# Patient Record
Sex: Male | Born: 1945
Health system: Southern US, Community
[De-identification: ages and names within clinical notes are randomized; demographics above are authoritative.]

## PROBLEM LIST (undated history)

## (undated) DIAGNOSIS — I10 Essential (primary) hypertension: Secondary | ICD-10-CM

## (undated) DIAGNOSIS — K279 Peptic ulcer, site unspecified, unspecified as acute or chronic, without hemorrhage or perforation: Secondary | ICD-10-CM

## (undated) DIAGNOSIS — I420 Dilated cardiomyopathy: Secondary | ICD-10-CM

## (undated) DIAGNOSIS — F1011 Alcohol abuse, in remission: Secondary | ICD-10-CM

## (undated) DIAGNOSIS — F17201 Nicotine dependence, unspecified, in remission: Secondary | ICD-10-CM

## (undated) DIAGNOSIS — N182 Chronic kidney disease, stage 2 (mild): Secondary | ICD-10-CM

## (undated) HISTORY — DX: Peptic ulcer, site unspecified, unspecified as acute or chronic, without hemorrhage or perforation: K27.9

## (undated) HISTORY — DX: Nicotine dependence, unspecified, in remission: F17.201

## (undated) HISTORY — DX: Chronic kidney disease, stage 2 (mild): N18.2

## (undated) HISTORY — DX: Alcohol abuse, in remission: F10.11

## (undated) HISTORY — DX: Dilated cardiomyopathy: I42.0

## (undated) HISTORY — DX: Essential (primary) hypertension: I10

---

## 2004-11-05 HISTORY — PX: COLONOSCOPY: SHX174

## 2005-06-02 ENCOUNTER — Emergency Department (HOSPITAL_COMMUNITY): Admission: EM | Admit: 2005-06-02 | Discharge: 2005-06-03 | Payer: Self-pay | Admitting: *Deleted

## 2005-06-20 ENCOUNTER — Ambulatory Visit (HOSPITAL_COMMUNITY): Admission: RE | Admit: 2005-06-20 | Discharge: 2005-06-20 | Payer: Self-pay | Admitting: Family Medicine

## 2009-02-07 ENCOUNTER — Ambulatory Visit: Payer: Self-pay | Admitting: Cardiology

## 2009-02-07 ENCOUNTER — Inpatient Hospital Stay (HOSPITAL_COMMUNITY): Admission: EM | Admit: 2009-02-07 | Discharge: 2009-02-11 | Payer: Self-pay | Admitting: Emergency Medicine

## 2009-02-08 ENCOUNTER — Encounter (INDEPENDENT_AMBULATORY_CARE_PROVIDER_SITE_OTHER): Payer: Self-pay | Admitting: Internal Medicine

## 2009-02-17 ENCOUNTER — Ambulatory Visit: Payer: Self-pay | Admitting: Cardiology

## 2009-03-15 ENCOUNTER — Ambulatory Visit: Payer: Self-pay | Admitting: Gastroenterology

## 2009-03-15 DIAGNOSIS — D649 Anemia, unspecified: Secondary | ICD-10-CM

## 2009-03-16 ENCOUNTER — Encounter: Payer: Self-pay | Admitting: Gastroenterology

## 2009-03-17 ENCOUNTER — Ambulatory Visit: Payer: Self-pay | Admitting: Cardiology

## 2009-04-21 ENCOUNTER — Ambulatory Visit: Payer: Self-pay | Admitting: Cardiology

## 2009-06-16 ENCOUNTER — Ambulatory Visit: Payer: Self-pay | Admitting: Gastroenterology

## 2009-06-22 LAB — CONVERTED CEMR LAB
Eosinophils Absolute: 0.1 10*3/uL (ref 0.0–0.7)
Ferritin: 98 ng/mL (ref 22–322)
HCT: 35.3 % — ABNORMAL LOW (ref 39.0–52.0)
Lymphocytes Relative: 26 % (ref 12–46)
Lymphs Abs: 1.5 10*3/uL (ref 0.7–4.0)
MCHC: 32.3 g/dL (ref 30.0–36.0)
Monocytes Relative: 12 % (ref 3–12)
Neutro Abs: 3.5 10*3/uL (ref 1.7–7.7)
RBC: 3.48 M/uL — ABNORMAL LOW (ref 4.22–5.81)
RDW: 13.6 % (ref 11.5–15.5)
WBC: 5.8 10*3/uL (ref 4.0–10.5)

## 2009-06-28 ENCOUNTER — Ambulatory Visit (HOSPITAL_COMMUNITY): Admission: RE | Admit: 2009-06-28 | Discharge: 2009-06-28 | Payer: Self-pay | Admitting: Gastroenterology

## 2009-06-28 ENCOUNTER — Encounter: Payer: Self-pay | Admitting: Gastroenterology

## 2009-06-28 ENCOUNTER — Ambulatory Visit: Payer: Self-pay | Admitting: Gastroenterology

## 2009-06-30 ENCOUNTER — Telehealth (INDEPENDENT_AMBULATORY_CARE_PROVIDER_SITE_OTHER): Payer: Self-pay

## 2009-07-18 ENCOUNTER — Ambulatory Visit: Payer: Self-pay | Admitting: Cardiology

## 2009-07-18 DIAGNOSIS — A048 Other specified bacterial intestinal infections: Secondary | ICD-10-CM | POA: Insufficient documentation

## 2009-08-18 ENCOUNTER — Encounter: Payer: Self-pay | Admitting: Cardiology

## 2009-08-18 ENCOUNTER — Ambulatory Visit: Payer: Self-pay | Admitting: Cardiology

## 2009-08-26 LAB — CONVERTED CEMR LAB
ALT: 10 units/L (ref 0–53)
Albumin: 4.5 g/dL (ref 3.5–5.2)
Alkaline Phosphatase: 55 units/L (ref 39–117)
Basophils Absolute: 0 10*3/uL (ref 0.0–0.1)
Creatinine, Ser: 2.16 mg/dL — ABNORMAL HIGH (ref 0.40–1.50)
Eosinophils Absolute: 0.1 10*3/uL (ref 0.0–0.7)
Eosinophils Relative: 2 % (ref 0–5)
HCT: 33.3 % — ABNORMAL LOW (ref 39.0–52.0)
Hemoglobin: 10.9 g/dL — ABNORMAL LOW (ref 13.0–17.0)
Monocytes Relative: 12 % (ref 3–12)
Total Protein: 7.1 g/dL (ref 6.0–8.3)

## 2009-08-31 ENCOUNTER — Encounter (INDEPENDENT_AMBULATORY_CARE_PROVIDER_SITE_OTHER): Payer: Self-pay | Admitting: *Deleted

## 2010-02-01 ENCOUNTER — Encounter: Payer: Self-pay | Admitting: Cardiology

## 2010-03-21 ENCOUNTER — Encounter (INDEPENDENT_AMBULATORY_CARE_PROVIDER_SITE_OTHER): Payer: Self-pay | Admitting: *Deleted

## 2010-07-11 ENCOUNTER — Ambulatory Visit: Payer: Self-pay | Admitting: Cardiology

## 2010-07-11 LAB — CONVERTED CEMR LAB
AST: 16 units/L (ref 0–37)
Albumin: 4.4 g/dL (ref 3.5–5.2)
Chloride: 105 meq/L (ref 96–112)
Creatinine, Ser: 1.53 mg/dL — ABNORMAL HIGH (ref 0.40–1.50)
Eosinophils Absolute: 0.2 10*3/uL (ref 0.0–0.7)
Eosinophils Relative: 2 % (ref 0–5)
Glucose, Bld: 72 mg/dL (ref 70–99)
Hemoglobin: 14.2 g/dL (ref 13.0–17.0)
MCV: 101.7 fL — ABNORMAL HIGH (ref 78.0–100.0)
Neutro Abs: 4.1 10*3/uL (ref 1.7–7.7)
Neutrophils Relative %: 56 % (ref 43–77)
Platelets: 222 10*3/uL (ref 150–400)
RBC: 4.06 M/uL — ABNORMAL LOW (ref 4.22–5.81)
RDW: 13.4 % (ref 11.5–15.5)
WBC: 7.3 10*3/uL (ref 4.0–10.5)

## 2010-07-13 ENCOUNTER — Ambulatory Visit: Payer: Self-pay | Admitting: Cardiovascular Disease

## 2010-07-13 ENCOUNTER — Ambulatory Visit (HOSPITAL_COMMUNITY): Admission: RE | Admit: 2010-07-13 | Discharge: 2010-07-13 | Payer: Self-pay | Admitting: Cardiology

## 2010-07-13 ENCOUNTER — Encounter: Payer: Self-pay | Admitting: Cardiology

## 2010-07-25 ENCOUNTER — Ambulatory Visit: Payer: Self-pay | Admitting: Cardiology

## 2010-08-09 ENCOUNTER — Telehealth (INDEPENDENT_AMBULATORY_CARE_PROVIDER_SITE_OTHER): Payer: Self-pay

## 2010-08-09 ENCOUNTER — Encounter: Payer: Self-pay | Admitting: Cardiology

## 2010-12-05 NOTE — Assessment & Plan Note (Signed)
Summary: E9B   Visit Type:  Follow-up Referring Provider:  . Primary Provider:  Dr. Dorthey Sawyer   History of Present Illness: [name] returns to the office after having been lost to followup for the past year.  Over that interval, he has done remarkably well.  He reports only class II dyspnea on exertion, has not required urgent medical care, and has not in fact sought any medical care.  He has successfully completely discontinued use of tobacco products and alcohol.  He requested this appointment not to address any current symptomatology, but recognizing that he requires continued medical care for his cardiac condition.  Current Medications (verified): 1)  Carvedilol 25 Mg Tabs (Carvedilol) .... Tak Eone Tablet By Mouth Twice A Day 2)  Lisinopril 40 Mg Tabs (Lisinopril) .... Take 1 Tablet By Mouth Once Daily 3)  Furosemide 40 Mg Tabs (Furosemide) .... Take One Tablet By Mouth Daily. 4)  Spironolactone 25 Mg Tabs (Spironolactone) .... Take One Tablet By Mouth Daily  Allergies (verified): 1)  ! Timothy Bowen  Past History:  PMH, FH, and Social History reviewed and updated.  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  The patient denies anorexia, weight loss, weight gain, vision loss, decreased hearing, hoarseness, chest pain, syncope, peripheral edema, prolonged cough, headaches, abdominal pain, and melena.    Vital Signs:  Patient profile:   65 year old male Weight:      153 pounds BMI:     24.78 Pulse rate:   92 / minute BP sitting:   183 / 92  (right arm)  Vitals Entered By: Dreama Saa, CNA (July 11, 2010 2:15  PM)  Physical Exam  General:  Proportionate weight and height; well developed; no acute distress Neck-No JVD; no carotid bruits: Lungs-No tachypnea, no rales; no rhonchi; no wheezes: Cardiovascular-normal PMI; normal S1 and S2: modest systolic murmur at the cardiac apex. Abdomen-BS normal; soft and non-tender without masses or organomegaly:  Musculoskeletal-No deformities, no cyanosis or clubbing: Neurologic-Normal cranial nerves; symmetric strength and tone:  Skin-Warm, no significant lesions: Extremities-Nl distal pulses; no edema:     Impression & Recommendations:  Problem # 1:  HYPERTENSION (ICD-401.1) Blood pressure is much higher at this visit than on any past assessment.  Patient has exhausted his supply of both ACE inhibitor and diuretic, which may account for the increase in pressure.  Alternatively, he may have had recovery of left ventricular systolic function and be capable of generating higher intravascular pressure.  An echocardiogram will be obtained to reassess LV function.  He will resume all of the medicines that he has previously been prescribed, increasing the dose of lisinopril from 20 to 40 mg, and monitor blood pressure at local pharmacies.  Adequacy of this therapy will be reassessed at a visit 2 weeks hence.  Problem # 2:  TOBACCO ABUSE-QUIT (ICD-305.1) Patient is congratulated for abstaining from both cigarette and alcohol use.  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  A repeat echocardiogram will reassess LV systolic function and help to guide additional treatment.  Problem #  4:  RENAL DISEASE, CHRONIC, STAGE II (ICD-585.2) Renal function will be reassessed.  Other Orders: T-Comprehensive Metabolic Panel [phone]) T-CBC w/Diff ([id number]) 2-D Echocardiogram (2D Echo) Future Orders: T-Basic Metabolic Panel [id number]) ...  08/07/2010  Patient Instructions: 1)  Your physician recommends that you schedule a follow-up appointment in: 2 weeks 2)  Your physician recommends that you return for lab work in: Today and in 1 month 3)  Your physician has recommended you make the following change in your medication: Increase Lisinopril to 40mg  by mouth once daily  4)  Your physician has requested that you regularly monitor and record your blood pressure readings at home.  Please use the same machine at the same time of day to check your readings and record them to bring to your follow-up visit. You may check blood pressures at any Drug store as often as you can. 5)  Your physician has requested that you have an echocardiogram.  Echocardiography is a painless test that uses sound waves to create images of your heart. It provides your doctor with information about the size and shape of your heart and how well your heart's chambers and valves are working.  This procedure takes approximately one hour. There are no restrictions for this procedure. Prescriptions: FUROSEMIDE 40 MG TABS (FUROSEMIDE) Take one tablet by mouth daily.  #30 x 3   Entered by:   Larita Fife Via LPN   Authorized by:   Kathlen Brunswick, MD, [facility]   Signed by:   Larita Fife Via LPN on 40/08/2724   Method used:   Electronically to        [facility]  [address] (retail)       [address]       [place], [place]  [address]       Ph: [phone]       Fax: [phone]   RxID:   [id number] LISINOPRIL 40 MG TABS (LISINOPRIL) take 1 tablet by mouth once daily  #30 x 3   Entered by:   Larita Fife Via LPN   Authorized by:   Kathlen Brunswick, MD, [facility]   Signed by:   Larita Fife Via LPN on 09/32/3557   Method used:   Electronically to        [facility]  [address] (retail)       [address]       [place], [place]  [address]       Ph: [phone]       Fax: [phone]   RxID:   [id number]

## 2010-12-05 NOTE — Letter (Signed)
Summary: Appointment - Reminder 2  [facility] [place]. [address] [place] [address]   Phone: [phone]  Fax: [phone]     Mar 21, 2010 MRN: [id number]   Timothy Bowen [address] [place], [place]  [address]   Dear Timothy Bowen,  Our records indicate that it is time to schedule a follow-up appointment.  Dr.  Dietrich Pates        recommended that you follow up with [place] in      01/2010      . It is very important that we reach you to schedue this appointment. We look forward to participating in your health care needs. Please contact us at the number listed above at your earliest convenience to schedule your appointment.  If you are unable to make an appointment at this time, give [place] a call so we can update our records.     Sincerely,   Glass blower/designer

## 2010-12-05 NOTE — Letter (Signed)
Summary: [facility]  [address], [place] [address]   Phone: [phone]  Fax: [phone]     August 09, 2010 MRN: [id number]   [facility] [address] [place], [place] [address] [place], [place]  [address]      YOUR LAB WORK IS DUE  August 14, 2010 _________________________________________  Please go to [facility], located across the street from [facility] on the second floor.  Hours are Monday - Friday 7am until 7:30pm         Saturday 8am until 12noon    __  DO NOT EAT OR DRINK AFTER MIDNIGHT EVENING PRIOR TO LABWORK  _X_ YOUR LABWORK IS NOT FASTING --YOU MAY EAT PRIOR TO LABWORK

## 2010-12-05 NOTE — Assessment & Plan Note (Signed)
Summary: 2 WK F/U PER CHECKOUT ON 07/11/10/T   Visit Type:  Follow-up Referring Provider:  . Primary Provider:  Dr. Dorthey Sawyer  CC:  no cardiology complaints.  History of Present Illness: [name] is a very pleasant 65 y/o AAM with know history of dialated cardiomyopathy, CKD, hypertension and anemia.  He is here for follow-up after being seen by Dr. Dietrich Pates to be reestablished in our clinic.  At that time he was found to be hypertensive and lisinopril 40mg  along with lasix, spironolactone, and Coreg.  He was also planned for and ECHO and renal fx lab tests.  He is here for discussion of results.  He is without cardiac complaints.  Preventive Screening-Counseling & Management  Alcohol-Tobacco     Alcohol drinks/day: 0     Smoking Status: quit  Current Medications (verified): 1)  Carvedilol 25 Mg Tabs (Carvedilol) .... Tak Eone Tablet By Mouth Twice A Day 2)  Lisinopril 40 Mg Tabs (Lisinopril) .... Take 1 Tablet By Mouth Once Daily 3)  Furosemide 40 Mg Tabs (Furosemide) .... Take One Tablet By Mouth Daily. 4)  Spironolactone 25 Mg Tabs (Spironolactone) .... Take One Tablet By Mouth Daily  Allergies (verified): 1)  ! * Nkda PMH-FH-SH reviewed-no changes except otherwise noted  Social History: Alcohol drinks/day:  0 Smoking Status:  quit  Review of Systems       All other systems have been reviewed and are negative unless stated above.   Vital Signs:  Patient profile:   65 year old male Weight:      155 pounds BMI:     25.11 Pulse rate:   88 / minute BP sitting:   138 / 83  (right arm)  Vitals Entered By: Dreama Saa, CNA (July 25, 2010 11:16 AM)  Physical Exam  General:  Well developed, well nourished, in no acute distress. Lungs:  Clear bilaterally to auscultation and percussion. Heart:  Non-displaced PMI, chest non-tender; regular rate and rhythm, S1, S2 without murmurs, rubs or gallops. Carotid upstroke normal, no bruit. Normal abdominal aortic size, no  bruits. Femorals normal pulses, no bruits. Pedals normal pulses. No edema, no varicosities. Abdomen:  Bowel sounds positive; abdomen soft and non-tender without masses, organomegaly, or hernias noted. No hepatosplenomegaly. Pulses:  pulses normal in all 4 extremities Extremities:  No clubbing or cyanosis. Psych:  Normal affect.   Impression & Recommendations:  Problem # 1:  CARDIOMYOPATHY, DILATED (ICD-425.4) Review of echo reveals distal septal hypokinesis.  The cavity size was mildly dialated with mild concentric hyptertrophy.  EF normal at 50%-55%.  He is to continue on current medications and follow up with [place] in 6 months unless he becomes symptomatic.  We discussed the test results and encouraged him to take medications as directed. His updated medication list for this problem includes:    Carvedilol 25 Mg Tabs (Carvedilol) .Marland Kitchen... Tak eone tablet by mouth twice a day    Lisinopril 40 Mg Tabs (Lisinopril) .Marland Kitchen... Take 1 tablet by mouth once daily    Furosemide 40 Mg Tabs (Furosemide) .Marland Kitchen... Take one tablet by mouth daily.    Spironolactone 25 Mg Tabs (Spironolactone) .Marland Kitchen... Take one tablet by mouth daily  Problem # 2:  RENAL DISEASE, CHRONIC, STAGE II (ICD-585.2) Review of labs demonstrates Creatnine of 1.53, BUN 23 without other abnormalities.  K was 4.7.  He will continue the same medications.  His creatnine actually improved from 2.5 on last check.  Will continue to monitor this with follow-up labs on next visit.  Problem # 3:  HYPERTENSION (ICD-401.1) Blood pressure is significantly improved with the addtion of the medications. dropping from 183/92 to 138/83 today.  He is congratulated on this and reminded to take medications daily. His updated medication list for this problem includes:    Carvedilol 25 Mg Tabs (Carvedilol) .Marland Kitchen... Tak eone tablet by mouth twice a day    Lisinopril 40 Mg Tabs (Lisinopril) .Marland Kitchen... Take 1 tablet by mouth once daily    Furosemide 40 Mg Tabs (Furosemide) .Marland Kitchen...  Take one tablet by mouth daily.    Spironolactone 25 Mg Tabs (Spironolactone) .Marland Kitchen... Take one tablet by mouth daily   Patient Instructions: 1)  Your physician recommends that you schedule a follow-up appointment in: 6 months 2)  Your physician recommends that you continue on your current medications as directed. Please refer to the Current Medication list given to you today. Prescriptions: LISINOPRIL 40 MG TABS (LISINOPRIL) take 1 tablet by mouth once daily  #30 x 3   Entered by:   Teressa Lower RN   Authorized by:   Joni Reining, NP   Signed by:   Teressa Lower RN on 07/25/2010   Method used:   Electronically to        [facility]  [address] [address] (retail)       [address]       [place], [place]  [address]       Ph: [phone]       Fax: [phone]   RxID:   [id number]

## 2010-12-05 NOTE — Miscellaneous (Signed)
Summary: Orders Update  Clinical Lists Changes  Orders: Added new Referral order of 2-D Echocardiogram (2D Echo) - Signed 

## 2010-12-05 NOTE — Progress Notes (Signed)
Summary: address  Phone Note Outgoing Call   Call placed by: Larita Fife Via LPN,  August 09, 2010 10:11 AM Summary of Call: Pt. receives mail at post office, not his home. Remailed lab orders. Initial call taken by: Larita Fife Via LPN,  August 09, 2010 10:12 AM

## 2010-12-05 NOTE — Letter (Signed)
Summary: [place]  [address] [place] [address]   Phone: [phone]  Fax: [phone]     July 11, 2010 MRN: [id number]   Timothy Bowen [address] [place], [place]  [address]      YOUR LAB WORK IS DUE  August 07, 2010 _________________________________________  Please go to [facility], located across the street from [facility] on the second floor.  Hours are Monday - Friday 7am until 7:30pm         Saturday 8am until 12noon    __  DO NOT EAT OR DRINK AFTER MIDNIGHT EVENING PRIOR TO LABWORK  __ YOUR LABWORK IS NOT FASTING --YOU MAY EAT PRIOR TO LABWORK

## 2011-01-09 DIAGNOSIS — F17201 Nicotine dependence, unspecified, in remission: Secondary | ICD-10-CM

## 2011-01-09 DIAGNOSIS — I10 Essential (primary) hypertension: Secondary | ICD-10-CM | POA: Insufficient documentation

## 2011-01-09 DIAGNOSIS — F1011 Alcohol abuse, in remission: Secondary | ICD-10-CM

## 2011-01-31 ENCOUNTER — Ambulatory Visit: Payer: Self-pay | Admitting: Cardiology

## 2011-02-14 LAB — URINALYSIS, ROUTINE W REFLEX MICROSCOPIC
Nitrite: NEGATIVE
Specific Gravity, Urine: <1.005 — ABNORMAL LOW (ref 1.005–1.030)
pH: 5.5 (ref 5.0–8.0)

## 2011-02-14 LAB — DIFFERENTIAL
Basophils Absolute: 0 10*3/uL (ref 0.0–0.1)
Basophils Absolute: 0 10*3/uL (ref 0.0–0.1)
Basophils Relative: 0 % (ref 0–1)
Basophils Relative: 0 % (ref 0–1)
Eosinophils Relative: 0 % (ref 0–5)
Lymphocytes Relative: 7 % — ABNORMAL LOW (ref 12–46)
Lymphs Abs: 1 10*3/uL (ref 0.7–4.0)
Monocytes Absolute: 1.2 10*3/uL — ABNORMAL HIGH (ref 0.1–1.0)
Monocytes Absolute: 1.3 10*3/uL — ABNORMAL HIGH (ref 0.1–1.0)
Monocytes Relative: 12 % (ref 3–12)
Neutro Abs: 12.6 10*3/uL — ABNORMAL HIGH (ref 1.7–7.7)
Neutrophils Relative %: 72 % (ref 43–77)

## 2011-02-14 LAB — CBC
Hemoglobin: 10.4 g/dL — ABNORMAL LOW (ref 13.0–17.0)
MCHC: 33.7 g/dL (ref 30.0–36.0)
MCHC: 34.1 g/dL (ref 30.0–36.0)
MCV: 105.4 fL — ABNORMAL HIGH (ref 78.0–100.0)
RBC: 2.9 MIL/uL — ABNORMAL LOW (ref 4.22–5.81)
RBC: 3.53 MIL/uL — ABNORMAL LOW (ref 4.22–5.81)
RDW: 13.9 % (ref 11.5–15.5)
WBC: 11.7 10*3/uL — ABNORMAL HIGH (ref 4.0–10.5)

## 2011-02-14 LAB — COMPREHENSIVE METABOLIC PANEL
Alkaline Phosphatase: 65 U/L (ref 39–117)
BUN: 28 mg/dL — ABNORMAL HIGH (ref 6–23)
GFR calc non Af Amer: 41 mL/min — ABNORMAL LOW (ref >60)
Glucose, Bld: 102 mg/dL — ABNORMAL HIGH (ref 70–99)
Potassium: 4.1 mEq/L (ref 3.5–5.1)
Total Bilirubin: 0.5 mg/dL (ref 0.3–1.2)
Total Protein: 5.8 g/dL — ABNORMAL LOW (ref 6.0–8.3)

## 2011-02-14 LAB — BASIC METABOLIC PANEL
CO2: 27 mEq/L (ref 19–32)
CO2: 28 mEq/L (ref 19–32)
Calcium: 8.6 mg/dL (ref 8.4–10.5)
Calcium: 8.6 mg/dL (ref 8.4–10.5)
Chloride: 103 mEq/L (ref 96–112)
Creatinine, Ser: 1.65 mg/dL — ABNORMAL HIGH (ref 0.4–1.5)
GFR calc Af Amer: 48 mL/min — ABNORMAL LOW (ref >60)
GFR calc non Af Amer: 38 mL/min — ABNORMAL LOW (ref >60)
GFR calc non Af Amer: 40 mL/min — ABNORMAL LOW (ref >60)
GFR calc non Af Amer: 43 mL/min — ABNORMAL LOW (ref >60)
Glucose, Bld: 111 mg/dL — ABNORMAL HIGH (ref 70–99)
Glucose, Bld: 96 mg/dL (ref 70–99)
Potassium: 3.7 mEq/L (ref 3.5–5.1)
Sodium: 134 mEq/L — ABNORMAL LOW (ref 135–145)
Sodium: 135 mEq/L (ref 135–145)
Sodium: 136 mEq/L (ref 135–145)

## 2011-02-14 LAB — LIPID PANEL
HDL: 32 mg/dL — ABNORMAL LOW (ref >39)
LDL Cholesterol: 88 mg/dL (ref 0–99)
Total CHOL/HDL Ratio: 4.1 RATIO
VLDL: 11 mg/dL (ref 0–40)

## 2011-02-14 LAB — CARDIAC PANEL(CRET KIN+CKTOT+MB+TROPI)
CK, MB: 1 ng/mL (ref 0.3–4.0)
CK, MB: 1.2 ng/mL (ref 0.3–4.0)
Relative Index: 1.1 (ref 0.0–2.5)

## 2011-02-14 LAB — TSH: TSH: 2.394 u[IU]/mL (ref 0.350–4.500)

## 2011-02-14 LAB — IRON AND TIBC
Iron: <10 ug/dL — ABNORMAL LOW (ref 42–135)
UIBC: 241 ug/dL

## 2011-02-14 LAB — URINE MICROSCOPIC-ADD ON

## 2011-02-14 LAB — POCT CARDIAC MARKERS: CKMB, poc: 1.4 ng/mL (ref 1.0–8.0)

## 2011-02-14 LAB — HEPATIC FUNCTION PANEL
ALT: 18 U/L (ref 0–53)
Alkaline Phosphatase: 61 U/L (ref 39–117)

## 2011-02-14 LAB — D-DIMER, QUANTITATIVE: D-Dimer, Quant: 1.91 ug/mL-FEU — ABNORMAL HIGH (ref 0.00–0.48)

## 2011-02-14 LAB — MAGNESIUM: Magnesium: 2.2 mg/dL (ref 1.5–2.5)

## 2011-02-14 LAB — BRAIN NATRIURETIC PEPTIDE: Pro B Natriuretic peptide (BNP): 1240 pg/mL — ABNORMAL HIGH (ref 0.0–100.0)

## 2011-02-26 ENCOUNTER — Encounter: Payer: Self-pay | Admitting: *Deleted

## 2011-02-26 ENCOUNTER — Encounter: Payer: Self-pay | Admitting: Cardiology

## 2011-02-26 ENCOUNTER — Ambulatory Visit (INDEPENDENT_AMBULATORY_CARE_PROVIDER_SITE_OTHER): Payer: Self-pay | Admitting: Cardiology

## 2011-02-26 DIAGNOSIS — I428 Other cardiomyopathies: Secondary | ICD-10-CM

## 2011-02-26 DIAGNOSIS — F17201 Nicotine dependence, unspecified, in remission: Secondary | ICD-10-CM

## 2011-02-26 DIAGNOSIS — I420 Dilated cardiomyopathy: Secondary | ICD-10-CM | POA: Insufficient documentation

## 2011-02-26 DIAGNOSIS — Z87891 Personal history of nicotine dependence: Secondary | ICD-10-CM

## 2011-02-26 DIAGNOSIS — N182 Chronic kidney disease, stage 2 (mild): Secondary | ICD-10-CM

## 2011-02-26 DIAGNOSIS — I10 Essential (primary) hypertension: Secondary | ICD-10-CM

## 2011-02-26 MED ORDER — AMLODIPINE BESYLATE 5 MG PO TABS: 5.0000 mg | ORAL_TABLET | Freq: Every day | ORAL | Status: DC

## 2011-02-26 NOTE — Assessment & Plan Note (Signed)
Blood pressure control somewhat suboptimal; antihypertensives will be changed as described above.

## 2011-02-26 NOTE — Patient Instructions (Signed)
Your physician recommends that you schedule a follow-up appointment in: 6 MONTHS Your physician has recommended you make the following change in your medication: STOP ALDACTONE, DECREASE LASIX TO 20MG  DAILY CALL FOR 5LB WEIGHT GAIN, AMLODIPINE 5MG  DIALY, Your physician has requested that you have en exercise stress myoview. For further information please visit [email or web address]. Please follow instruction sheet, as given.

## 2011-02-26 NOTE — Progress Notes (Signed)
HPI : [name] returns to the office as scheduled for continued assessment and treatment of hypertension, hypertensive heart disease and cardiomyopathy.  Since his last visit, he has done quite well.  He is class 1-2 dyspnea on exertion.  Experiences no chest discomfort, orthopnea, PND nor pedal edema.  He has completely given up tobacco use and alcohol use, and feels better than he has for some time.  Ejection fraction, which initially was 25%, recovered to 50%.  Blood pressure has been fairly well controlled with systolics in the 130-150 range  Current Outpatient Prescriptions on File Prior to Visit  Medication Sig Dispense Refill  . carvedilol (COREG) 25 MG tablet Take 25 mg by mouth 2 (two) times daily.        . furosemide (LASIX) 40 MG tablet Take 20 mg by mouth daily.       Marland Kitchen lisinopril (PRINIVIL,ZESTRIL) 40 MG tablet Take 40 mg by mouth daily.        Marland Kitchen DISCONTD: spironolactone (ALDACTONE) 25 MG tablet Take 25 mg by mouth daily.        Marland Kitchen amLODipine (NORVASC) 5 MG tablet Take 1 tablet (5 mg total) by mouth daily.  30 tablet  6     No Known Allergies    Past medical history, social history, and family history reviewed and updated.  ROS: See history of present illness.  PHYSICAL EXAM: BP 150/88  Pulse 71  Ht 6' (1.829 m)  Wt 153 lb (69.4 kg)  BMI 20.75 kg/m2  SpO2 93%  General-Well developed; no acute distress Body habitus-proportionate weight and height Neck-No JVD; no carotid bruits Lungs-clear lung fields; resonant to percussion Cardiovascular-normal PMI; normal S1 and S2; modest systolic ejection murmur at the lower left sternal border Abdomen-normal bowel sounds; soft and non-tender without masses or organomegaly Musculoskeletal-No deformities, no cyanosis or clubbing Neurologic-Normal cranial nerves; symmetric strength and tone Skin-Warm, no significant lesions Extremities-distal pulses intact; no edema  ASSESSMENT AND PLAN:

## 2011-02-26 NOTE — Assessment & Plan Note (Signed)
Substantial improvement both symptomatically and in terms of left ventricular systolic function.  There are multiple possibilities to account for this difference.  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  The medications may be providing benefit that is out of proportion to the response we typically see.  He could have coronary disease with improved ischemia under therapy, or he could have a hypertensive cardiomyopathy on her blood pressure has not been high enough to warrant this diagnosis.  We will proceed with a stress nuclear study to exclude significant obstructive coronary disease.  With near normal LV systolic function, his dose of furosemide will be decreased to 20 mg q.d.; Aldactone will be eliminated; and amlodipine will be started for better blood pressure control.

## 2011-03-05 ENCOUNTER — Other Ambulatory Visit: Payer: Self-pay | Admitting: Cardiology

## 2011-03-05 DIAGNOSIS — I420 Dilated cardiomyopathy: Secondary | ICD-10-CM

## 2011-03-06 ENCOUNTER — Ambulatory Visit (HOSPITAL_COMMUNITY): Payer: Self-pay | Attending: Cardiology

## 2011-03-06 ENCOUNTER — Encounter (HOSPITAL_COMMUNITY): Payer: Self-pay

## 2011-03-06 ENCOUNTER — Ambulatory Visit (INDEPENDENT_AMBULATORY_CARE_PROVIDER_SITE_OTHER): Payer: Self-pay | Admitting: *Deleted

## 2011-03-06 ENCOUNTER — Encounter (HOSPITAL_COMMUNITY)
Admission: RE | Admit: 2011-03-06 | Discharge: 2011-03-06 | Disposition: A | Discharge status: Home / Self Care | Payer: Self-pay | Source: Ambulatory Visit | Attending: Cardiology | Admitting: Cardiology

## 2011-03-06 DIAGNOSIS — I428 Other cardiomyopathies: Secondary | ICD-10-CM | POA: Insufficient documentation

## 2011-03-06 DIAGNOSIS — I420 Dilated cardiomyopathy: Secondary | ICD-10-CM

## 2011-03-06 MED ORDER — TECHNETIUM TC 99M TETROFOSMIN IV KIT
10.0000 | PACK | Freq: Once | INTRAVENOUS | Status: AC | PRN
  Administered 2011-03-06: 9.4 via INTRAVENOUS

## 2011-03-06 MED ORDER — TECHNETIUM TC 99M TETROFOSMIN IV KIT
30.0000 | PACK | Freq: Once | INTRAVENOUS | Status: AC | PRN
  Administered 2011-03-06: 28.5 via INTRAVENOUS

## 2011-03-06 NOTE — Progress Notes (Signed)
Exercise Treadmill Test   Please see dictated report for Stress Nuclear Study.

## 2011-03-20 NOTE — Consult Note (Signed)
NAME:  Timothy Bowen, Timothy Bowen              ACCOUNT NO.:  [id number]   MEDICAL RECORD NO.:  [id number]          PATIENT TYPE:  INP   LOCATION:  IC04                          FACILITY:  [facility]   PHYSICIAN:  Gerrit Friends. Dietrich Pates, MD, FACCDATE OF BIRTH:  January 23, 1946   DATE OF CONSULTATION:  02/08/2009  DATE OF DISCHARGE:                                 CONSULTATION   REFERRING PHYSICIAN:  Dr. Sherle Poe, of [facility].   PRIMARY CARE PHYSICIAN:  Dr. Nobie Putnam.   REASON FOR CONSULTATION:  Congestive heart failure.   HISTORY OF PRESENT ILLNESS:  Timothy Bowen is a 65 year old male with  untreated hypertension who presented to the [facility]  emergency room yesterday with complaints of shortness of breath for the  past 2 days.  He notes dyspnea with exertion and describes NYHA Class  III-IV symptoms.  He notes positive orthopnea as well as paroxysmal  nocturnal dyspnea.  He denies chest pain or syncope.  He denies cough or  wheezing.  He eventually decided to come to the emergency room where a  chest x-ray demonstrated congestive heart failure and his BNP was noted  to be elevated.  Blood pressure was significantly elevated at 193/115.  He was given Lasix IV 40 mg, nitroglycerin and Lopressor.  He has  apparently diuresed well, although the records do not indicate this.  He  feels much better with decreased shortness of breath.   PAST MEDICAL HISTORY:  Is as outlined above.  He denies any prior  cardiology evaluation or admission to the hospital with heart failure.  He denies any surgical history.   MEDICATIONS AT HOME:  None.   ALLERGIES:  No known drug allergies.   SOCIAL HISTORY:  The patient lives in [place] by himself.  He drinks  about 2 beers per day.  He has a 60 pack-year history of smoking, he  quit smoking about 2 months ago.  He has a prior history of marijuana  and cocaine use.  He denies using cocaine for the last 8 months.  He is  unemployed and previously  was employed with the [employer]  plant.   FAMILY HISTORY:  Significant for a stroke in his mother who is deceased.  Two sisters have CAD.  They had bypass surgery in their 39s.   REVIEW OF SYSTEMS:  Please see HPI.  Denies fevers but has had chills.  Denies headache or sore throat, rash, dysuria, hematuria, melena,  dysphagia.  He has had some lower extremity pain with walking.  He also  notes numbness in his right upper extremity and right lower extremity  for the last year.  He denies associated weakness.  He did have some  bright red blood per rectum recently after straining with a difficult  bowel movement.  All other systems reviewed and negative.   PHYSICAL EXAM:  He is a well-nourished, well-developed male in no acute  distress.  Blood pressure 127/77, pulse 100, respirations 18,  temperature 98.6, saturation 98% on 2 liters, T-max 100.5, ins and outs  442 in/450 out.  HEENT:  Normal.  NECK:  With minimal JVD.  LYMPH:  Without lymphadenopathy.  ENDOCRINE:  Without thyromegaly.  CARDIAC:  Normal S1-S2, regular rate and rhythm.  Positive S3.  Questionable early 1/6 systolic ejection murmur heard best along left  sternal border.  Two component friction rub also noted.  LUNGS:  With bibasilar rales, no wheezes.  SKIN:  Warm and dry without rash.  ABDOMEN:  Soft, nontender with normoactive bowel sounds.  No  organomegaly.  EXTREMITIES:  Without clubbing, cyanosis or edema.  MUSCULOSKELETAL:  Without joint deformity.  NEUROLOGIC:  He is alert and oriented x3.  Cranial nerves II-XII are  grossly intact.  Strength is 5/5 in all extremities and axial groups.  VASCULAR:  No carotid bruits noted bilaterally.  Dorsalis pedis and  posterior tibialis pulses are 1+ bilaterally.   Chest x-ray:  Cardiac enlargement with vascular congestion and mild  interstitial edema; basilar atelectasis.  Electrocardiogram:  Sinus  tachycardia with a heart rate of 117, RSR prime right and  left atrial  enlargement, left ventricular hypertrophy, RSR prime nonspecific ST-T  wave changes, prolonged QT with a QTc of 496.   LABS:  White count 11,700, hemoglobin 10.4, hematocrit 30.8, MCV 106,  potassium 3.7, creatinine 1.81, glucose 11, BNP 1240, D-dimer 1.91,  troponin-I of 0.08, 0.04, 0.06.   ASSESSMENT:  1. Acute congestive heart failure in the setting of uncontrolled      hypertensive crisis.  2. Renal insufficiency.  His prior creatinine in 2006 was 1.1.  3. Macrocytic anemia.  4. Elevated D-dimer.  5. Possible pericarditis based upon two component friction rub on      exam.  This raises the possibility of coronary artery disease with      recent myocardial infarction or pericarditis/myocarditis, human      immunodeficiency virus.  6. Ex-smoker.  7. Family history of coronary artery disease.  8. Prolonged QT.   RECOMMENDATIONS:  The patient was also interviewed and examined by Dr.  Dietrich Pates.  An echocardiogram will be obtained to evaluate for systolic  vs. diastolic dysfunction.  His diuresis will be continued with IV Lasix  and this will be increased to 40 mg 2 times daily.  Daily weights and  strict ins and outs will be kept.  His nitroglycerin will be weaned off.  He will be placed on amlodipine 5 mg a day for blood pressure control.  Will change his Lopressor over to carvedilol.  He may get a better blood  pressure effect from a nonselective beta blocker.  If he has systolic  dysfunction this would also be beneficial.  No ACE inhibitor will be  used at this time secondary to elevated creatinine.  Question if his  kidney disease is related to his uncontrolled hypertension.  We could  consider the addition of hydralazine and/or nitrates, if needed,  especially if his LV function is depressed.  We can adjust his  medications further based upon his LV function.  He does have an  elevated D-dimer, but the likelihood of simultaneous pulmonary embolism  and congestive  heart failure is low.  We do not plan to investigate this  further at present.  B12 and folate will be obtained secondary to his  macrocytic anemia as well as LFTs and a fasting lipid panel.  Will also  obtain a sed rate, ANA rheumatoid factor, TSH, magnesium, iron, TIBC.  His stools will be checked for blood x2.  Renal ultrasound as well as an  abdominal ultrasound will be obtained.  We will try  to compare his  current electrocardiogram to his prior electrocardiograms to further  assess his prolonged QT.  A urinalysis will also be obtained to assess  for proteinuria.  Additional evaluation and treatment will be determined  by his echocardiogram results.   Thank you very much for the consultation.  We will be glad to follow the  patient throughout the remainder of this admission.      Tereso Newcomer, PA-C      Gerrit Friends. Dietrich Pates, MD, [facility]  Electronically Signed    SW/MEDQ  D:  02/08/2009  T:  02/08/2009  Job:  [id number]   cc:   Margaretmary Dys, M.D.

## 2011-03-20 NOTE — H&P (Signed)
NAME:  Timothy Bowen, Timothy Bowen              ACCOUNT NO.:  [id number]   MEDICAL RECORD NO.:  [id number]          PATIENT TYPE:  INP   LOCATION:  IC04                          FACILITY:  [facility]   PHYSICIAN:  Margaretmary Dys, M.D.DATE OF BIRTH:  Nov 22, 1945   DATE OF ADMISSION:  02/07/2009  DATE OF DISCHARGE:  LH                              HISTORY & PHYSICAL   ADMISSION DIAGNOSES:  1. Malignant hypertension.  2. Congestive heart failure.  3. Cannot rule out angina equivalent.  4. History of noncompliance to medications.  5. Probable transient ischemic attack in the past.   CHIEF COMPLAINT:  Shortness of breath.   HISTORY OF PRESENT ILLNESS:  Timothy Bowen is a 65 year old male who  presented to the emergency room with shortness of breath.  The patient  said he was diagnosed with hypertension about 9 months ago but has been  unable to afford the medications because he lost his insurance.  The  patient reports some chronic right arm and leg numbness for the past  year.  However, his shortness of breath started 2 days ago.  He said it  had been pretty gradual.  The patient says it was exacerbated by  exertion.  He also noticed some significant shortness of breath when he  is sleeping at night and he is unable to lay flat.  He has to sometimes  wake up and sit up to breathe.  The patient states usually the shortness  of breath is relieved by him resting.  The patient denied any chest  pain.  He has had no nausea and vomiting.  He denies any fevers or  chills.  No weight changes.  He denies any leg edema.  He was evaluated  in the emergency room and was found to have severe hypertension with  blood pressure close to 200.  The patient was also in congestive heart  failure.  The patient was subsequently started on nitroglycerin infusion  with some improvement in his blood pressure now.   The patient again continued to say he had no chest pain.   REVIEW OF SYSTEMS:  A 10-point review of systems is  negative except as  mentioned in the history of present illness.   PAST MEDICAL HISTORY:  Hypertension diagnosed about 9 months ago, the  patient is not on any therapy.   PAST SURGICAL HISTORY:  None of note.   MEDICATIONS:  The patient does not take any medicines.   ALLERGIES:  No known drug allergies.   FAMILY HISTORY:  Positive for hypertension and coronary artery disease.  His father died of a myocardial infarction.  His mother died of a  stroke.  He has other brothers and sisters who also have significant  history of hypertensive heart disease.   SOCIAL HISTORY:  The patient smoked about a pack of cigarettes for 40  years, but quit about a week ago.  He drinks alcohol, about 2-3 cups of  beer.  He has no illicit drug use.  He denies any other recreational  drug use.  The patient is currently single and has three children who  are  healthy.  He is currently unemployed after having lost his job at a  [employer] recently.   PHYSICAL EXAMINATION:  GENERAL:  Conscious, alert, comfortable not in  acute distress.  Well-oriented to time, place and person.  VITAL SIGNS:  Blood pressure 198/123 with pulse of 96.  Respiratory rate  was ranging between 20 to 24.  Temperature was 98.5 degrees Fahrenheit.  Oxygen saturation was 98% on 2 liters nasal cannula.  HEENT:  Normocephalic and atraumatic.  Oral mucosa was moist with no  exudates.  NECK:  Supple with no JVD or lymphadenopathy.  LUNGS:  Bilateral crackles mostly at the bases.  No wheeze, no rhonchi.  HEART:  S1 and S2 regular.  No S3, S4, gallops or rubs.  ABDOMEN:  Soft, nontender and bowel sounds positive.  No masses  palpable.  EXTREMITIES:  Trace edema bilaterally with no calf induration or  tenderness noted.  CNS:  Grossly intact.  The patient had no focal neurological deficits.  Sensation was also intact.   LABORATORY DATA:  White blood cell count 14.8, hemoglobin 12.7,  hematocrit 37.2, platelet count 251 with 85%  neutrophils.  D-dimer was  1.9.  Sodium 134, potassium 4.1, chloride 103, CO2 22, glucose 121, BUN  17, creatinine 1.65, calcium 8.9.  Initial cardiac markers were  negative.  BNP was 1190.   Chest x-ray shows evidence of pulmonary vascular congestion with basilar  atelectasis consistent with pulmonary edema.   A 12-lead EKG revealed normal sinus rhythm with some sinus tachycardia  and no acute ST-T changes.   ASSESSMENT/PLAN:  This is a 65 year old male with history of poorly  controlled hypertension who now presents with severe malignant  hypertension and he is in congestive heart failure.  Primary concern is  that this may represent some angina equivalent.  1. The patient will be admitted to the intensive care unit.  2. We will continue the patient on intravenous nitroglycerin.  3. We will cycle his cardiac enzymes.  4. We will obtain cardiology consult with [facility].  5. We will obtain a two-dimensional echocardiogram in the morning.  6. We will start the patient on metoprolol 25 mg p.o. b.i.d.  7. We will place the patient on Lasix 40 mg IV daily.  The patient      already received a Lasix dose in the emergency room.  We will watch      his potassium and electrolytes.  8. We will put him on deep venous thrombosis prophylaxis with Lovenox.  9. I do not see any indication for gastrointestinal prophylaxis at      this time.  10.I will start the patient on aspirin 81 mg once a day.  The patient      is having symptoms that may be suggestive of a prior transient      ischemic attack.  We will continue to follow him closely.  The      patient may need a CT scan or MRI later when more stable to further      evaluate for any prior history and basically to risk stratify him.  11.Smoking cessation counseling and encouragement was given to the      patient.  12.We will obtain a fasting lipid profile in the morning on him.   Critical care time was 40 minutes.      Margaretmary Dys, M.D.  Electronically Signed     AM/MEDQ  D:  02/07/2009  T:  02/08/2009  Job:  [id number]

## 2011-03-20 NOTE — Assessment & Plan Note (Signed)
[facility]                       [facility] OFFICE NOTE   SAADIQ, POCHE                     MRN:          [id number]  DATE:03/17/2009                            DOB:          December 05, 1945    Mr. Avis Mcmahill returns to the office for continued assessment and  treatment of cardiomyopathy with severe hypertension and mild chronic  kidney disease.  Since his last visit, he has done superbly.  He is  walking and riding a bicycle without any dyspnea or chest discomfort.  He notes no orthopnea nor PND.  He [facility], but they recently were unable to provide him with  any of his drugs.   CURRENT MEDICATIONS:  1. Ramipril 2.5 mg b.i.d.  2. Spironolactone 25 mg daily.  3. Carvedilol 6.25 mg b.i.d. (supposed to be taking 12.5 mg b.i.d.).  4. Furosemide 40 mg daily.  5. Amlodipine 5 mg daily.  6. Aspirin 81 mg daily.   SOCIAL HISTORY:  Reviewed.  Alcohol consumption was never excessive,  generally six-pack of beer per week.  This was discontinued in April.  He also had a 40-pack-year history of cigarette consumption, which was  discontinued likely before he discontinued alcohol.   On exam, trim, pleasant gentleman in no acute distress.  The weight is 140 pounds, 3 pounds more than last month.  Blood pressure  155/90, heart rate 80 and regular, respirations 14 and unlabored.  NECK:  No jugular venous distention.  LUNGS:  Clear.  CARDIAC:  Normal first and second heart sounds; modest early systolic  ejection murmur.  Normal PMI.  ABDOMEN:  Soft and nontender.  EXTREMITIES:  No edema.   Most recent laboratories from last month.  Anemia has improved, but  still persistent with a hemoglobin of 11.8 and an MCV of 100.  Chemistry  profile showed improved renal function with a BUN of 31 and a creatinine  of 1.66.   IMPRESSION:  Mr. Tres Grzywacz has cardiomyopathy of uncertain  etiology.  Coronary disease is still a consideration, and cardiac  catheterization is probably a test that should be performed at some  point.  For the time being, we will continue to adjust his medications.  Ramipril will be changed to lisinopril 20 mg daily.  Carvedilol will be  increased to 25 mg b.i.d.  He will return in 1 month at which time a chemistry profile will be  repeated.  He will ultimately need a repeat echocardiogram and  consideration for a defibrillator.     Gerrit Friends. Dietrich Pates, MD, [facility]  Electronically Signed    RMR/MedQ  DD: 03/17/2009  DT: 03/18/2009  Job #: [id number]   cc:   in [facility] Department

## 2011-03-20 NOTE — Letter (Signed)
April 21, 2009    Timothy Mckusick, MD  [phone] [place] Timothy Bowen, [place] [address]   RE:  Timothy, Bowen  MRN:  [id number]  /  DOB:  Mar 02, 1946   Dear Timothy Bowen,   Timothy Bowen returns to the office for continued assessment and treatment  of hypertension and cardiomyopathy.  Since his last visit, he has done  extremely well.  He reports no dyspnea, lightheadedness nor syncope.  He  has no pedal edema.  He has gained approximately 10 pounds over the past  few months.  He is able to work in the garden without difficulty.   CURRENT MEDICATIONS:  Supposed to include,  1. Spironolactone 25 mg daily.  2. Carvedilol 25 mg b.i.d.  3. Furosemide 40 mg daily.  4. Amlodipine 5 mg daily.  5. Aspirin 81 mg daily.  6. Lisinopril 20 mg daily.   Unfortunately, he is taking 2 different prescriptions of carvedilol, one  for 25 mg b.i.d. and one for 6.25 mg b.i.d.  He is not taking any ACE  inhibitor.   He was evaluated by GI apparently, but was unable to afford the prep for  what I believe was his colonoscopy.  It sounds as if he will not be able  to pursue that evaluation or will have to do it through some plan for  uninsured patients.  He is refraining from use of alcohol or cigarettes  and he is congratulated on that.   PHYSICAL EXAMINATION:  GENERAL:  Trim, pleasant gentleman in no acute  distress.  VITAL SIGNS:  The weight is 147, 7 pounds more than 1 month ago.  Blood  pressure 120/75, heart rate 85 and regular, respirations 12 and  unlabored.  NECK:  No jugular venous distention.  LUNGS:  Clear.  CARDIAC:  Normal first heart sounds; increased intensity of the second  heart sounds; no third heart sound appreciated; minimal basilar systolic  murmur.  ABDOMEN:  Soft and nontender; no organomegaly.  EXTREMITIES:  No edema.   IMPRESSION:  [name] is doing beautifully with his current  medical regime.  ACE inhibitor will reduce his long-term morbidity and  mortality and will  be added as planned.  Aspirin is offering him no  benefit and will be discontinued.  He may ultimately get off amlodipine  if blood pressure remains controlled with his other medications.  I will  reassess this nice gentleman in 3 months.  He will monitor weight and  call me for 5-pound gain or for any new symptoms.    Sincerely,      Timothy Friends. Dietrich Pates, MD, [facility]  Electronically Signed    RMR/MedQ  DD: 04/21/2009  DT: 04/22/2009  Job #: [id number]

## 2011-03-20 NOTE — Group Therapy Note (Signed)
NAME:  Timothy Bowen, Timothy Bowen              ACCOUNT NO.:  [id number]   MEDICAL RECORD NO.:  [id number]          PATIENT TYPE:  INP   LOCATION:  A339                          FACILITY:  [facility]   PHYSICIAN:  Dorris Singh, DO    DATE OF BIRTH:  September 05, 1946   DATE OF PROCEDURE:  DATE OF DISCHARGE:                                 PROGRESS NOTE   The patient was seen today.  He states he feels pretty good, discussed  with him about his congestive heart failure and a Cardiology is  evaluating him for adjusting his meds and we will place him on  medications that he can afford, so that he can be more compliant in the  future.  He has no complaints today.  We will transfer him out of the  ICU to the General Med Floor.   PHYSICAL EXAMINATION:  VITAL SIGNS:  Temperature 97.1, pulse 87,  respirations 16, and blood pressure 145/81.  GENERAL:  The patient is a well developed, well nourished in no acute  distress.  HEART:  Regular rate and rhythm.  LUNGS:  Clear to auscultation bilaterally.  ABDOMEN:  Soft, nontender.  EXTREMITIES:  Positive pulses.  No edema, ecchymosis, or cyanosis.   LABORATORY FINDINGS:  His labs for today are as follows.  His sodium is  134, potassium 4.1, chloride 100, CO2 27, glucose 102, BUN 28, and  creatinine 1.7.   ASSESSMENT AND PLAN:  1. Congestive heart failure.  The patient continued to do well.      Cardiology is adjusting his medications due to his severe ejection      fraction and myopathy.  2. Malignant hypertension.  He has been controlled.  We will send him      home on medications at discharge.  3. History of transient ischemic attack.  This is something that the      patient has been mentioned and he needs to take his medications on      a regular basis.  4. History of alcohol use.  Recommended he decrease some of his bad      habits to improve the quality of his life, which include smoking      and drinking.      Dorris Singh, DO  Electronically  Signed     CB/MEDQ  D:  02/10/2009  T:  02/11/2009  Job:  [id number]

## 2011-03-20 NOTE — Op Note (Signed)
NAME:  Timothy Bowen, Timothy Bowen              ACCOUNT NO.:  [id number]   MEDICAL RECORD NO.:  [id number]          PATIENT TYPE:  AMB   LOCATION:  DAY                           FACILITY:  [facility]   PHYSICIAN:  Kassie Mends, M.D.      DATE OF BIRTH:  1946-07-11   DATE OF PROCEDURE:  06/28/2009  DATE OF DISCHARGE:  06/28/2009                               OPERATIVE REPORT   PROCEDURES:  1. Colonoscopy.  2. Esophagogastroduodenoscopy with cold forceps biopsy.   INDICATION FOR EXAMINATION:  Timothy Bowen is a 65 year old male who  presents with a history of marijuana and cocaine abuse.  He presented  with anemia and heme positive stools.  He did not get his initial  colonoscopy because he could not afford the prep.  The patient had a  hemoglobin of 11.4 with an MCV of 101.4, and a ferritin of 98 in August  2010.   FINDINGS:  1. Significant amount of retained particular matter in the colon.      Polyps less than 5 mm would had been easily missed.  No masses,      inflammatory changes, or arteriovenous malformations.  2. Few diverticula seen in the ascending colon.  3. Small internal hemorrhoids.  Otherwise normal retroflex view of the      rectum.  4. Normal esophagus without evidence of Barrett's mass, erosion,      ulceration, or stricture.  5. Patchy erythema in the antrum.  Biopsies obtained via cold forceps      to evaluate for H. pylori gastritis.  6. Patchy erythema in the duodenal bulb.  Normal second portion of the      duodenum.   DIAGNOSES:  1. Timothy Bowen likely has heme-positive stools and normocytic anemia      from gastritis and chronic disease.  2. Mild ascending colon diverticulosis.  3. Small internal hemorrhoids.  4. Gastritis and duodenitis   RECOMMENDATIONS:  1. Will call Mr. Nakanishi with results of biopsies.  2. Add omeprazole 20 mg daily.  3. Screening colonoscopy in 5 years due to fair bowel prep.  4. He should follow high-fiber diet.  He was given a handout on high-   fiber diet and diverticulosis.  5. No aspirin, NSAIDs, or anticoagulation for 5 days.   MEDICATIONS:  1. Demerol 100 mg IV.  2. Versed 5 mg IV.   PROCEDURE TECHNIQUE:  Physical exam was performed, informed consent was  obtained from the patient.  After explaining the benefits, risks, and  alternatives to the procedure, the patient was connected to the monitor  and placed in left lateral position.  Continuous oxygen was provided by  nasal cannula and IV Medicine administered through an indwelling  cannula.  After administration of sedation and rectal exam, the  patient's rectum was intubated and the scope was advanced under direct  visualization to the cecum.  The scope was removed slowly by carefully  examining the color, texture, anatomy, and integrity of the mucosa on  the way out.   After the colonoscopy, the patient's esophagus was intubated with a  diagnostic gastroscope and the scope  was advanced under direct  visualization to the second portion of the duodenum.  The scope was  removed slowly by carefully examining the color, texture, anatomy, and  integrity of the mucosa on the way out.  The patient was recovered in  endoscopy and discharged home in satisfactory condition.   PATH:  H. PYLORI GASTRITIS. RX:ABO x 10 days.      Kassie Mends, M.D.  Electronically Signed     SM/MEDQ  D:  06/28/2009  T:  06/29/2009  Job:  [id number]   cc:   Corrie Mckusick, M.D.  Fax: [phone]   Gerrit Friends. Dietrich Pates, MD, [facility]  [address]  [place], [place] [address]

## 2011-03-20 NOTE — Group Therapy Note (Signed)
NAME:  Timothy Bowen, Timothy Bowen              ACCOUNT NO.:  [id number]   MEDICAL RECORD NO.:  [id number]          PATIENT TYPE:  INP   LOCATION:  IC04                          FACILITY:  [facility]   PHYSICIAN:  Dorris Singh, DO    DATE OF BIRTH:  August 16, 1946   DATE OF PROCEDURE:  02/09/2009  DATE OF DISCHARGE:                                 PROGRESS NOTE   The patient was seen today had testing done and reviewed his  echocardiogram with him and explained to him the nature of his decreased  ejection fraction.  Cardiology is seeing the patient as well.  They have  recommended a change in his medication and stated that he does have  congestive heart failure.   His vitals are as follows:  Currently vitals are stable.  GENERAL:  The patient is a 65 year old African male who is well-  developed, well-nourished and in no acute distress.  HEART:  Regular rate and rhythm.  LUNGS:  Clear to auscultation bilaterally with diminished breath sounds.  ABDOMEN:  Soft, nontender and nondistended.  EXTREMITIES:  Positive pulses.  No edema, ecchymosis or cyanosis.   LABS:  Today, his sodium is 135, potassium is 3.2, chloride 99, CO2 of  27,  glucose 96, creatinine 1.94.   ASSESSMENT/PLAN:  1. Congestive heart failure.  We will continue the patient on current      recommended cardiac regimen.  He seems to be more clinically      improved.  We will go ahead and transfer him out of the intensive      care unit since he has improved.  2. Malignant hypertension.  This has been improved as well.  He was      weaned off of nitrogen drip and was started on Norvasc.  We will      continue to monitor this.  3. History transient ischemic attack in the past.  The patient seems      to be doing well.  I distressed to him the importance of taking      care of his blood pressure so that he does not have a stroke and      counseled him extensively on management of his medications and the      importance of it.      Dorris Singh, DO  Electronically Signed     CB/MEDQ  D:  02/09/2009  T:  02/09/2009  Job:  [id number]

## 2011-03-20 NOTE — Discharge Summary (Signed)
NAME:  Timothy Bowen, Timothy Bowen              ACCOUNT NO.:  [id number]   MEDICAL RECORD NO.:  [id number]          PATIENT TYPE:  INP   LOCATION:  A339                          FACILITY:  [facility]   PHYSICIAN:  Dorris Singh, DO    DATE OF BIRTH:  October 02, 1946   DATE OF ADMISSION:  02/07/2009  DATE OF DISCHARGE:  04/09/2010LH                               DISCHARGE SUMMARY   PRIMARY CARE PHYSICIAN:  Dr. Burt Knack.   ADMISSION DIAGNOSES:  1. Malignant hypertension.  2. Congestive heart failure.  3. Possibly angina equivalent.  4. History of noncompliance to medications.  5. Probable transient ischemic attack in the past.   DISCHARGE DIAGNOSES:  1. Malignant hypertension, under control.  2. Congestive heart failure with an ejection fraction of 20-25%  3. History of medical noncompliance.  4. History of transit ischemic attack.  5. History of alcohol abuse and tobacco abuse.   His tests that were done include an echocardiogram on February 08, 2009,  which demonstrated an EF of 20-25%.  He has moderately dilated left  ventricle and mild-to-moderate hypokinesis of the anterior septum of the  region in the apex.  Overall, left ventricular function was severely  reduced.  There was moderate aortic valvular regurgitation, effective  orifice of mitral regurgitation by proximal isovelocity surface area was  0.9 cm2.  The volume of mitral regurgitation by proximal isovelocity the  surface area was 13 mL. The left atrium was moderately dilated.  There  was mild right ventricular hypertrophy and inferior vena cava was mildly  dilated.  His radiology tests that were done include February 07, 2009, one-  view portable chest cardiac enlargement, vascular congestion, mild  interstitial edema, bilateral atelectasis.  On February 09, 2009, chest x-  ray was done, improved pulmonary aeration as above.  Ultrasound of the  abdomen showed small left kidney, tiny left pleural effusion, no acute  upper abdominal abnormalities.   HOSPITAL COURSE:  He was admitted to the service of [facility].  The  patient is a 65 year old male who became extremely short of breath and  was diagnosed with hypertension, but lost his insurance and has not  gotten any of his medications for at least 9 months.  He was found to be  in malignant hypertension and congestive heart failure.  He was admitted  to the intensive care unit in which he was placed on intravenous nitro.  They cycled his cardiac enzymes and [facility] was consulted.  He also obtained a 2-D echo and was started on medications IV as well as  p.o.  He was put on DVT and GI prophylaxis.  Cardiology saw the patient  and increased his medication and placed him amlodipine, and also checked  him for proteinuria.  The patient continue to progress.  An  echocardiogram was done which demonstrated an EF fraction as noted  above.  It was determined on February 09, 2009, the patient to be  transferred out of the ICU.  His hypertension was controlled and he had  no complaints of chest pain.  The Cardiology was managing his CHF  through medical management and  they increased his medication.  On February 11, 2009, it was determined that Cardiology felt that he was okay to be  discharged to home.  We will help him obtain his medications, so that he  can stay compliant and we will set him up with Dr. Phillips Odor for an  appointment.  He will be sent home on Coreg 6.25 mg p.o. b.i.d. with  meals, Altace 2.5 mg p.o. b.i.d., Norvasc 5 mg p.o. daily,  spironolactone 25 mg p.o. daily, and Lasix 40 mg p.o. daily.  He has  instructions are to stop smoking and to start stop drinking alcohol.  He  is to have a low-heart healthy diet.  He is to follow up with his  physicians as directed and has had multiple counseling sessions by me on  how to take better care of himself and stating that currently with all  of his disease states he will not getting any better, but he needs to  maintain the current  health that he has.  His condition is stable and  his disposition will be to home.       Dorris Singh, DO  Electronically Signed     CB/MEDQ  D:  02/11/2009  T:  02/12/2009  Job:  [id number]

## 2011-03-20 NOTE — Assessment & Plan Note (Signed)
[facility]                       [place] CARDIOLOGY OFFICE NOTE   Timothy Bowen, Timothy Bowen                     MRN:          [id number]  DATE:02/17/2009                            DOB:          Jun 28, 1946    CARDIOLOGIST:  Gerrit Friends. Dietrich Pates, MD, [facility]   PRIMARY CARE PHYSICIAN:  Dr. Phillips Odor.   REASON FOR VISIT:  Posthospitalization followup.   HISTORY OF PRESENT ILLNESS:  Timothy Bowen is a 65 year old male patient  who was recently evaluated at [facility] with acute systolic  congestive heart failure.  He was noted to have an EF of 20-25%.  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  The ultimate plan was to eventually proceed  with cardiac catheterization.  The patient's medicines were adjusted for  his cardiomyopathy during his admission.  His blood work demonstrated  macrocytic anemia.  He was noted to be iron deficient.  No Hemoccults  were obtained during his hospitalization.  No further workup was pursued  for his anemia.  He also had noted renal insufficiency with a creatinine  in the 1.7 range.  In the office today, he notes he is doing well.  He  denies any significant shortness of breath.  He denies orthopnea or PND.  He denies chest pain.  He denies syncope.  He denies pedal edema.   CURRENT MEDICATIONS:  1. Ramipril 2.5 mg b.i.d.  2. Spirolactone 25 mg daily.  3. Carvedilol 6.25 mg b.i.d.  4. Furosemide 40 mg daily.  5. Amlodipine 5 mg daily.   ALLERGIES:  No known drug allergies.   PHYSICAL EXAMINATION:  GENERAL:  He is a well-nourished well-developed  male in no acute distress.  VITAL SIGNS:  Blood pressure 129/77, pulse 85, weight 137 pounds.  HEENT:  Normal.  NECK:  Without JVD at 45 degrees.  CARDIAC:  Normal S1 and S2.  Regular rate and rhythm.  No S3.  LUNGS:  Clear to auscultation bilaterally.  No rales.  ABDOMEN:  Soft, nontender.  EXTREMITIES:   Without edema.  NEUROLOGIC:  He is alert and oriented x3.  Cranial nerves II through XII  grossly intact.   ASSESSMENT AND PLAN:  1. 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  The patient's      echocardiogram demonstrated virtual akinesis of the inferoseptal,      inferior, and posterior walls, some mild-to-moderate hypokinesis of      the anteroseptal region and apex.  He also had moderate AI and      trivial MR.  From a volume standpoint, he is optivolemic.  We will      continue to try to up titrate his medications for his      cardiomyopathy by increasing his carvedilol to 12.5 mg b.i.d.  He      will have blood pressure rechecked with a nurse in 1 week.  I      discussed this case further with Dr. Dietrich Pates.  We will hold off on  cardiac catheterization at this point in time until his anemia is      further evaluated.  2. Iron-deficiency anemia.  During his hospitalization, his B12 and      folate levels were normal.  He does have a history of excessive      alcohol use.  His iron level was less than 10.  We will obtain      followup CBC as well as stool cards x3.  We will refer him to      Gastroenterology for further evaluation of his iron-deficiency      anemia.  I apprised Dr. Phillips Odor of the pending referral.  3. Chronic kidney disease.  We will also check a followup BMET to      reassess his renal function.  4. Hypertension.  This is well controlled.   DISPOSITION:  The patient will be brought back in followup with Dr.  Dietrich Pates in 1 month or sooner p.r.n.      Tereso Newcomer, PA-C  Electronically Signed      Gerrit Friends. Dietrich Pates, MD, [facility]  Electronically Signed   SW/MedQ  DD: 02/17/2009  DT: 02/18/2009  Job #: [id number]   cc:   Dr. Phillips Odor

## 2011-03-20 NOTE — Group Therapy Note (Signed)
NAME:  Timothy Bowen, Timothy Bowen              ACCOUNT NO.:  [id number]   MEDICAL RECORD NO.:  [id number]          PATIENT TYPE:  INP   LOCATION:  IC04                          FACILITY:  [facility]   PHYSICIAN:  Margaretmary Dys, M.D.DATE OF BIRTH:  21-Mar-1946   DATE OF PROCEDURE:  02/08/2009  DATE OF DISCHARGE:                                 PROGRESS NOTE   SUBJECTIVE:  The patient feels better today.  He says his shortness of  breath is improved.  He denies any chest pain.  The patient has been  seen by cardiology and recommendations were made regarding medication.  The patient denies any numbness in his feet.  He is currently still on  nitroglycerin infusion and we will try to wean that off.   OBJECTIVE:  The patient is alert, comfortable, not in acute distress,  well-oriented to time, place and person.  VITAL SIGNS:  His blood pressure is currently 140/92, respiratory rate  16, temperature 98.5 degrees Fahrenheit, oxygen saturation is about 100%  on 2 liters nasal cannula.  HEENT EXAM:  Normocephalic, atraumatic, oral mucosa was moist with no  exudates.  NECK:  Supple, no JVD or lymphadenopathy.  LUNGS:  Decreased air entry bilaterally with crackles at the bases but  much improved compared to yesterday.  HEART:  S1, S2 regular, no S3-S4, gallops or rubs.  ABDOMEN:  Soft, nontender, bowel sounds positive.  No masses palpable.  EXTREMITIES:  Trace edema.  No calf induration or tenderness.  CNS EXAM:  He had no focal neurologic deficits.   LABORATORY/DIAGNOSTIC DATA:  White blood cell count is 11.7, hemoglobin  of 10.4, hematocrit is 30.8, platelet count was 206 with no left shift.  Sodium was 134, potassium is 3.7, chloride of 103, CO2 is 24, glucose is  111, BUN of 18, creatinine was 1.81, calcium is 8.3.  Cardiac enzymes  were negative.  BNP is 1240.   ASSESSMENT AND PLAN:  1. Malignant hypertension.  2. Congestive heart failure.  3. Probable underlying coronary artery disease.  4.  Probable transient ischemic attacks/stroke in the past.   PLAN:  1. I appreciate cardiology input.  2. The patient's metoprolol has been changed to carvedilol at 6.25 mg      p.o. b.i.d.  3. Will try to wean him off nitroglycerin.  4. The patient also has been tried on amlodipine 5 mg once daily.  5. He remains hemodynamically stable and will probably transfer him      out of intensive care unit once we have been able to wean off the      nitroglycerin.   Critical care time was 40 minutes.      Margaretmary Dys, M.D.  Electronically Signed     AM/MEDQ  D:  02/08/2009  T:  02/08/2009  Job:  [id number]

## 2011-03-23 NOTE — H&P (Signed)
NAME:  Timothy Bowen, Timothy Bowen              ACCOUNT NO.:  [id number]   MEDICAL RECORD NO.:  [id number]          PATIENT TYPE:  AMB   LOCATION:  DAY                           FACILITY:  [facility]   PHYSICIAN:  Dalia Heading, M.D.  DATE OF BIRTH:  16-Dec-1945   DATE OF ADMISSION:  DATE OF DISCHARGE:  LH                                HISTORY & PHYSICAL   CHIEF COMPLAINT:  Need for screening colonoscopy.   HISTORY OF PRESENT ILLNESS:  The patient is a 65 year old black male who is  referred for endoscopic evaluation.  He needs colonoscopy for screening  purposes.  No abdominal pain, weight loss, nausea, vomiting, diarrhea,  constipation, melena or hematochezia had been noted.  He has never had a  colonoscopy.  There is no family history of colon carcinoma.   PAST MEDICAL HISTORY:  Hypertension.   PAST SURGICAL HISTORY:  Unremarkable.   CURRENT MEDICATIONS:  1.  Benicar.  2.  Meclizine.   ALLERGIES:  No known drug allergies.  /REVIEW OF SYSTEMS/>  The patient smokes a pack of cigarettes per day.  He does drink alcohol.  He  denies any other cardiopulmonary difficulties or bleeding disorders.   PHYSICAL EXAMINATION:  GENERAL APPEARANCE:  The patient is a well-developed,  well-nourished black male in no acute distress.  VITAL SIGNS:  He is afebrile and vital signs are stable.  LUNGS:  Clear to auscultation with good breath sounds bilaterally.  HEART:  Regular rate and rhythm without S3, S4 or murmurs.  ABDOMEN:  Soft, nontender, nondistended.  No hepatosplenomegaly or masses  were noted.  RECTAL:  Deferred to the procedure.   IMPRESSION:  Need for screening colonoscopy.   PLAN:  The patient is scheduled for a colonoscopy on July 17, 2005.  Risks and benefits of the procedure including bleeding and perforation were  fully explained to the patient.  Gave informed consent.      Dalia Heading, M.D.  Electronically Signed     MAJ/MEDQ  D:  07/03/2005  T:  07/03/2005  Job:   [id number]   cc:   Patrica Duel, M.D.  [address]  [place]  [place] [address]  Fax: [phone]

## 2011-10-05 ENCOUNTER — Ambulatory Visit (INDEPENDENT_AMBULATORY_CARE_PROVIDER_SITE_OTHER): Payer: Self-pay | Admitting: Cardiology

## 2011-10-05 ENCOUNTER — Encounter: Payer: Self-pay | Admitting: Cardiology

## 2011-10-05 VITALS — BP 152/76 | HR 89 | Ht 66.0 in | Wt 156.0 lb

## 2011-10-05 DIAGNOSIS — I10 Essential (primary) hypertension: Secondary | ICD-10-CM

## 2011-10-05 DIAGNOSIS — I428 Other cardiomyopathies: Secondary | ICD-10-CM

## 2011-10-05 DIAGNOSIS — I420 Dilated cardiomyopathy: Secondary | ICD-10-CM

## 2011-10-05 DIAGNOSIS — N182 Chronic kidney disease, stage 2 (mild): Secondary | ICD-10-CM

## 2011-10-05 MED ORDER — AMLODIPINE BESYLATE 10 MG PO TABS: 10.0000 mg | ORAL_TABLET | Freq: Every day | ORAL | Status: DC

## 2011-10-05 NOTE — Progress Notes (Signed)
Patient ID: Timothy Bowen, male   DOB: [birth year], 65 y.o.   MRN: [id number] HPI: Return visit for this very nice gentleman with cardiomyopathy.  He has done extremely well since his last visit with class II dyspnea on exertion, no chest discomfort, no edema, no orthopnea, no PND, no palpitations and no syncope.  He has not required a visit with his PCP nor has he been seen in the emergency department or hospitalized.  He is tolerating all of his medications without apparent adverse effects.  Prior to Admission medications   Medication Sig Start Date End Date Taking? Authorizing Provider  amLODipine (NORVASC) 5 MG tablet Take 1 tablet (5 mg total) by mouth daily. 02/26/11 02/26/12 Yes Gerrit Friends. Polk Minor, MD  carvedilol (COREG) 25 MG tablet Take 25 mg by mouth 2 (two) times daily.     Yes Historical Provider, MD  furosemide (LASIX) 40 MG tablet Take 20 mg by mouth daily.    Yes Historical Provider, MD  lisinopril (PRINIVIL,ZESTRIL) 40 MG tablet Take 40 mg by mouth daily.     Yes Historical Provider, MD   No Known Allergies    Past medical history, social history, and family history reviewed and updated.  ROS: See history of present illness  PHYSICAL EXAM: BP 152/76  Pulse 89  Ht 5\' 6"  (1.676 m)  Wt 70.761 kg (156 lb)  BMI 25.18 kg/m2; weight increased 3 pounds since last office visit General-Well developed; no acute distress; poor dentition Body habitus-proportionate weight and height Neck-No JVD; no carotid bruits Lungs-clear lung fields; resonant to percussion Cardiovascular-normal PMI; normal S1 and S2; fourth heart sound present Abdomen-normal bowel sounds; soft and non-tender without masses or organomegaly Musculoskeletal-No deformities, no cyanosis or clubbing Neurologic-Normal cranial nerves; symmetric strength and tone Skin-Warm, no significant lesions Extremities-distal pulses intact; no edema  ASSESSMENT AND PLAN:   Bing, MD 10/05/2011 12:33 PM

## 2011-10-05 NOTE — Assessment & Plan Note (Signed)
Repeat metabolic profile will be obtained.

## 2011-10-05 NOTE — Assessment & Plan Note (Addendum)
Blood pressure control is somewhat suboptimal.  Dose of amlodipine will be increased to 10 mg per day and patient asked to monitor blood pressures at home.  He will return in one month for reassessment by the cardiology nurses.

## 2011-10-05 NOTE — Assessment & Plan Note (Addendum)
Patient is doing very well symptomatically, which is consistent with the marked improvement in the function of the left ventricle once appropriate medical therapy was started.  Current medications will be continued.  Stress nuclear suggests an ischemic etiology for the patient's cardiomyopathy with a sizable previous infarction in the distribution of the posterior descending artery.  In the absence of active ischemia, coronary angiography and possible intervention would not necessarily be beneficial.  LV systolic function by nuclear imaging is borderline to suggest the need for an AICD, but EF by echocardiography was near normal.  We will continue to monitor left ventricular systolic function, but will not plan to refer for AICD implantation at present.

## 2011-10-05 NOTE — Patient Instructions (Signed)
Your physician recommends that you schedule a follow-up appointment in:  1 year with Dr Dietrich Pates 2 months with Nurse for Blood Pressure Check  Your physician has recommended you make the following change in your medication:  Increase Norvasc (Amlodipine) to 10 mg daily  Your physician has requested that you regularly monitor and record your blood pressure readings at home. Please use the same machine at the same time of day to check your readings and record them to bring to your follow-up visit.  Your physician recommends that you return for lab work in: Monday

## 2011-10-09 ENCOUNTER — Other Ambulatory Visit: Payer: Self-pay | Admitting: Cardiology

## 2011-10-23 ENCOUNTER — Encounter: Payer: Self-pay | Admitting: *Deleted

## 2011-10-29 IMAGING — NM NM MYOCAR SINGLE W/SPECT W/WALL MOTION & EF
2 series · 12 of 12 positions shown · non-contrast
Comparison: none

Ordering Physician: PORFILIO SOSA CRUZ

[place] Physician: [REDACTED]al Data: 65-year-old gentleman with cardiomyopathy.
NUCLEAR MEDICINE STRESS MYOVIEW STUDY WITH SPECT AND LEFT
VENTRICULAR EJECTION FRACTION
Radionuclide Data: One-day rest/stress protocol performed with
[DATE] mCi of Ac-GGm Myoview.
Stress Data: Treadmill exercise performed to a workload of seven
mets and a heart rate of 155, 98% of age predicted maximum.
Exercise discontinued due to dyspnea and fatigue; no chest
discomfort reported.  Blood pressure increased from a resting value
170/100 to 230/110 during exercise and 250/100 early in recovery, a
moderate to severe hypertensive response from a hypertensive
baseline.  No arrhythmias noted.
EKG: Normal sinus rhythm; left atrial abnormality; minor
nonspecific ST-T wave abnormalities.
Stress EKG:  No significant change
Scintigraphic Data: Acquisition was technically good.  The left
ventricle was mildly to moderately dilated.  The left kidney was
not imaged despite the fact that the right nephrogram was clearly
visible.  On tomographic images reconstructed in standard planes,
there was a moderately severe defect of moderate size located
inferiorly but extending inferolaterally, inferoapical a and
inferoseptally.  By comparison to the resting portion of the study,
minimal reversibility was noted at the inferolateral margin
although this was almost exclusively a persistent defect.  The
gated reconstruction demonstrated moderate global hypokinesis with
slightly worse function of the inferolateral wall and apex.
Overall LV systolic function was moderately impaired with an
estimated ejection fraction of 32%.  There was normal systolic
accentuation of activity in all segments except for the base of the
inferior wall.

[Series 1: cr cardiac tc low dose · 6.41mm/px · 6 of 64 frames shown]
[frame 6/64]
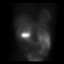
[frame 16/64]
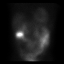
[frame 27/64]
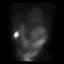
[frame 38/64]
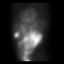
[frame 48/64]
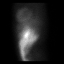
[frame 59/64]
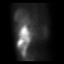

[Series 1: cs cardiac tc hi dose · 6.41mm/px · 6 of 512 frames shown]
[frame 43/512]
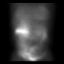
[frame 128/512]
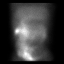
[frame 214/512]
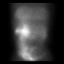
[frame 299/512]
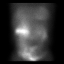
[frame 384/512]
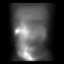
[frame 470/512]
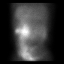

[12 of 12 positions shown; findings below may reference images not displayed]

IMPRESSION: Abnormal stress nuclear myocardial study revealing impaired
exercise capacity, no significant stress-induced EKG abnormalities,
substantial left ventricular dilatation and dysfunction, and
inferior wall scarring without significant ischemia.  Other
findings as noted.

## 2011-12-05 ENCOUNTER — Other Ambulatory Visit: Payer: Self-pay | Admitting: Adult Health

## 2011-12-05 ENCOUNTER — Encounter: Payer: Self-pay | Admitting: *Deleted

## 2011-12-06 ENCOUNTER — Ambulatory Visit (INDEPENDENT_AMBULATORY_CARE_PROVIDER_SITE_OTHER): Payer: Self-pay | Admitting: *Deleted

## 2011-12-06 ENCOUNTER — Encounter: Payer: Self-pay | Admitting: *Deleted

## 2011-12-06 VITALS — BP 152/82 | HR 86 | Wt 158.0 lb

## 2011-12-06 DIAGNOSIS — I10 Essential (primary) hypertension: Secondary | ICD-10-CM

## 2011-12-06 MED ORDER — AMLODIPINE BESYLATE 10 MG PO TABS: 10.0000 mg | ORAL_TABLET | Freq: Every day | ORAL | Status: DC

## 2011-12-06 NOTE — Progress Notes (Signed)
Presents today for 1 month blood pressure check.  States he has not had his Amlodipine for at least 30 days, as it was called in for a 90 day supply and was $10 along with his lisinopril, which was also $10 and could not afford both.  Advised him that I would call in a 30 day supply for $4, which he will get filled tomorrow and we will bring him back in 2 weeks for a recheck.

## 2011-12-08 NOTE — Progress Notes (Signed)
Patient ID: Timothy Bowen, male   DOB: 21-Nov-1945, 66 y.o.   MRN: [id number] Agree with plan as outlined.

## 2011-12-21 ENCOUNTER — Telehealth: Payer: Self-pay | Admitting: *Deleted

## 2011-12-21 ENCOUNTER — Encounter: Payer: Self-pay | Admitting: *Deleted

## 2011-12-21 NOTE — Telephone Encounter (Signed)
Patient presented for blood pressure check and states that he was unable to fill amlodipine due to cost.  It is no longer on $4 list at [facility].  BP 169/82.  Visit was not completed and nurse visit scheduled for next week.  New list requested from [facility].  He is, otherwise taking his medications as presceibed.

## 2012-01-03 ENCOUNTER — Other Ambulatory Visit: Payer: Self-pay | Admitting: *Deleted

## 2012-01-03 DIAGNOSIS — I10 Essential (primary) hypertension: Secondary | ICD-10-CM

## 2012-01-03 MED ORDER — AMLODIPINE BESYLATE 10 MG PO TABS: 10.0000 mg | ORAL_TABLET | Freq: Every day | ORAL | Status: DC

## 2012-01-23 ENCOUNTER — Other Ambulatory Visit: Payer: Self-pay | Admitting: *Deleted

## 2012-01-23 DIAGNOSIS — I10 Essential (primary) hypertension: Secondary | ICD-10-CM

## 2012-12-01 ENCOUNTER — Encounter: Payer: Self-pay | Admitting: *Deleted

## 2012-12-01 ENCOUNTER — Encounter: Payer: Self-pay | Admitting: Cardiology

## 2012-12-01 ENCOUNTER — Ambulatory Visit (INDEPENDENT_AMBULATORY_CARE_PROVIDER_SITE_OTHER): Payer: Self-pay | Admitting: Cardiology

## 2012-12-01 VITALS — BP 168/98 | HR 89 | Ht 66.5 in | Wt 154.1 lb

## 2012-12-01 DIAGNOSIS — I428 Other cardiomyopathies: Secondary | ICD-10-CM

## 2012-12-01 DIAGNOSIS — R5381 Other malaise: Secondary | ICD-10-CM

## 2012-12-01 DIAGNOSIS — R0602 Shortness of breath: Secondary | ICD-10-CM

## 2012-12-01 DIAGNOSIS — I10 Essential (primary) hypertension: Secondary | ICD-10-CM

## 2012-12-01 DIAGNOSIS — I420 Dilated cardiomyopathy: Secondary | ICD-10-CM

## 2012-12-01 DIAGNOSIS — R5383 Other fatigue: Secondary | ICD-10-CM

## 2012-12-01 MED ORDER — CLONIDINE HCL 0.1 MG PO TABS: 0.1000 mg | ORAL_TABLET | Freq: Two times a day (BID) | ORAL | Status: DC

## 2012-12-01 MED ORDER — CHLORTHALIDONE 25 MG PO TABS: 25.0000 mg | ORAL_TABLET | Freq: Every day | ORAL | Status: DC

## 2012-12-01 NOTE — Progress Notes (Deleted)
Name: Timothy Bowen    DOB: [birth year]  Age: 67 y.o.  MR#: [id number]       PCP:  Colette Ribas, MD      Insurance: @PAYORNAME @   CC:    Chief Complaint  Patient presents with  . Follow-up   MEDICATION LIST  VS BP 168/98  Pulse 89  Ht 5' 6.5" (1.689 m)  Wt 154 lb 1.3 oz (69.89 kg)  BMI 24.50 kg/m2  SpO2 97%  Weights Current Weight  12/01/12 154 lb 1.3 oz (69.89 kg)  12/06/11 158 lb (71.668 kg)  10/05/11 156 lb (70.761 kg)    Blood Pressure  BP Readings from Last 3 Encounters:  12/01/12 168/98  12/06/11 152/82  10/05/11 152/76     Admit date:  (Not on file) Last encounter with RMR:  Visit date not found   Allergy No Known Allergies  Current Outpatient Prescriptions  Medication Sig Dispense Refill  . amLODipine (NORVASC) 10 MG tablet Take 1 tablet (10 mg total) by mouth daily.  30 tablet  12  . carvedilol (COREG) 25 MG tablet Take 25 mg by mouth 2 (two) times daily.        . furosemide (LASIX) 40 MG tablet TAKE ONE TABLET BY MOUTH EVERY DAY  30 tablet  11  . lisinopril (PRINIVIL,ZESTRIL) 20 MG tablet TAKE TWO TABLETS BY MOUTH EVERY DAY  60 tablet  6    Discontinued Meds:   There are no discontinued medications.  Patient Active Problem List  Diagnosis  . Hypertension  . Alcohol abuse, in remission  . Dilated cardiomyopathy  . Chronic renal disease, stage II  . Tobacco abuse, in remission    LABS No visits with results within 3 Month(s) from this visit. Latest known visit with results is:  [facility] Conversion Encounter on 08/18/2009  Component Date Value  . WBC 08/18/2009 5.1   . RBC 08/18/2009 3.32*  . Hemoglobin 08/18/2009 10.9*  . HCT 08/18/2009 33.3*  . MCV 08/18/2009 100.3*  . MCHC 08/18/2009 32.7   . RDW 08/18/2009 12.9   . Platelets 08/18/2009 170   . Neutrophils Relative 08/18/2009 51   . Neutro Abs 08/18/2009 2.6   . Lymphocytes Relative 08/18/2009 35   . Lymphs Abs 08/18/2009 1.8   . Monocytes Relative 08/18/2009 12   . Monocytes  Absolute 08/18/2009 0.6   . Eosinophils Relative 08/18/2009 2   . Eosinophils Absolute 08/18/2009 0.1   . Basophils Relative 08/18/2009 0   . Basophils Absolute 08/18/2009 0.0   . WBC Morphology 08/18/2009 Criteria for review not met   . RBC Morphology 08/18/2009 Criteria for review not met   . Sodium 08/18/2009 140   . Potassium 08/18/2009 4.9   . Chloride 08/18/2009 107   . CO2 08/18/2009 20   . Glucose, Bld 08/18/2009 91   . BUN 08/18/2009 38*  . Creatinine, Ser 08/18/2009 2.16*  . Total Bilirubin 08/18/2009 0.4   . Alkaline Phosphatase 08/18/2009 55   . AST 08/18/2009 14   . ALT 08/18/2009 10   . Total Protein 08/18/2009 7.1   . Albumin 08/18/2009 4.5   . Calcium 08/18/2009 9.3   . WBC 07/11/2010 7.3   . RBC 07/11/2010 4.06*  . Hemoglobin 07/11/2010 14.2   . HCT 07/11/2010 41.3   . MCV 07/11/2010 101.7*  . MCHC 07/11/2010 34.4   . RDW 07/11/2010 13.4   . Platelets 07/11/2010 222   . Neutrophils Relative 07/11/2010 56   . Neutro Abs 07/11/2010 4.1   .  Lymphocytes Relative 07/11/2010 31   . Lymphs Abs 07/11/2010 2.3   . Monocytes Relative 07/11/2010 11   . Monocytes Absolute 07/11/2010 0.8   . Eosinophils Relative 07/11/2010 2   . Eosinophils Absolute 07/11/2010 0.2   . Basophils Relative 07/11/2010 0   . Basophils Absolute 07/11/2010 0.0   . Sodium 07/11/2010 142   . Potassium 07/11/2010 4.7   . Chloride 07/11/2010 105   . CO2 07/11/2010 26   . Glucose, Bld 07/11/2010 72   . BUN 07/11/2010 23   . Creatinine, Ser 07/11/2010 1.53*  . Total Bilirubin 07/11/2010 0.7   . Alkaline Phosphatase 07/11/2010 65   . AST 07/11/2010 16   . ALT 07/11/2010 12   . Total Protein 07/11/2010 7.3   . Albumin 07/11/2010 4.4   . Calcium 07/11/2010 9.5      Results for this Opt Visit:     Results for orders placed in visit on 08/18/09  CONVERTED CEMR LAB      Component Value Range   WBC 5.1  4.0-10.5 10*3/microliter   RBC 3.32 (*) 4.22-5.81 M/uL   Hemoglobin 10.9 (*)  13.0-17.0 g/dL   HCT 16.1 (*) 09.6-04.5 %   MCV 100.3 (*) 78.0-100.0 fL   MCHC 32.7  30.0-36.0 g/dL   RDW 40.9  81.1-91.4 %   Platelets 170  150-400 K/uL   Neutrophils Relative 51  43-77 %   Neutro Abs 2.6  1.7-7.7 K/uL   Lymphocytes Relative 35  12-46 %   Lymphs Abs 1.8  0.7-4.0 K/uL   Monocytes Relative 12  3-12 %   Monocytes Absolute 0.6  0.1-1.0 K/uL   Eosinophils Relative 2  0-5 %   Eosinophils Absolute 0.1  0.0-0.7 K/uL   Basophils Relative 0  0-1 %   Basophils Absolute 0.0  0.0-0.1 K/uL   WBC Morphology Criteria for review not met     RBC Morphology Criteria for review not met     Sodium 140  135-145 meq/L   Potassium 4.9  3.5-5.3 meq/L   Chloride 107  96-112 meq/L   CO2 20  19-32 meq/L   Glucose, Bld 91  70-99 mg/dL   BUN 38 (*) 7-82 mg/dL   Creatinine, Ser 9.56 (*) 0.40-1.50 mg/dL   Total Bilirubin 0.4  0.3-1.2 mg/dL   Alkaline Phosphatase 55  39-117 units/L   AST 14  0-37 units/L   ALT 10  0-53 units/L   Total Protein 7.1  6.0-8.3 g/dL   Albumin 4.5  2.1-3.0 g/dL   Calcium 9.3  8.6-57.8 mg/dL  CONVERTED CEMR LAB      Component Value Range   WBC 7.3  4.0-10.5 10*3/microliter   RBC 4.06 (*) 4.22-5.81 M/uL   Hemoglobin 14.2  13.0-17.0 g/dL   HCT 46.9  62.9-52.8 %   MCV 101.7 (*) 78.0-100.0 fL   MCHC 34.4  30.0-36.0 g/dL   RDW 41.3  24.4-01.0 %   Platelets 222  150-400 K/uL   Neutrophils Relative 56  43-77 %   Neutro Abs 4.1  1.7-7.7 K/uL   Lymphocytes Relative 31  12-46 %   Lymphs Abs 2.3  0.7-4.0 K/uL   Monocytes Relative 11  3-12 %   Monocytes Absolute 0.8  0.1-1.0 K/uL   Eosinophils Relative 2  0-5 %   Eosinophils Absolute 0.2  0.0-0.7 K/uL   Basophils Relative 0  0-1 %   Basophils Absolute 0.0  0.0-0.1 K/uL   Sodium 142  135-145 meq/L   Potassium 4.7  3.5-5.3 meq/L  Chloride 105  96-112 meq/L   CO2 26  19-32 meq/L   Glucose, Bld 72  70-99 mg/dL   BUN 23  0-98 mg/dL   Creatinine, Ser 1.19 (*) 0.40-1.50 mg/dL   Total Bilirubin 0.7  0.3-1.2 mg/dL    Alkaline Phosphatase 65  39-117 units/L   AST 16  0-37 units/L   ALT 12  0-53 units/L   Total Protein 7.3  6.0-8.3 g/dL   Albumin 4.4  1.4-7.8 g/dL   Calcium 9.5  2.9-56.2 mg/dL    EKG Orders placed in visit on 12/01/12  . EKG 12-LEAD     Prior Assessment and Plan Problem List as of 12/01/2012            Cardiology Problems   Hypertension   Last Assessment & Plan Note   10/05/2011 Office Visit Addendum 10/07/2011  1:56 PM by Kathlen Brunswick, MD    Blood pressure control is somewhat suboptimal.  Dose of amlodipine will be increased to 10 mg per day and patient asked to monitor blood pressures at home.  He will return in one month for reassessment by the cardiology nurses.    Dilated cardiomyopathy   Last Assessment & Plan Note   10/05/2011 Office Visit Addendum 10/07/2011  1:56 PM by Kathlen Brunswick, MD    Patient is doing very well symptomatically, which is consistent with the marked improvement in the function of the left ventricle once appropriate medical therapy was started.  Current medications will be continued.  Stress nuclear suggests an ischemic etiology for the patient's cardiomyopathy with a sizable previous infarction in the distribution of the posterior descending artery.  In the absence of active ischemia, coronary angiography and possible intervention would not necessarily be beneficial.  LV systolic function by nuclear imaging is borderline to suggest the need for an AICD, but EF by echocardiography was near normal.  We will continue to monitor left ventricular systolic function, but will not plan to refer for AICD implantation at present.      Other   Alcohol abuse, in remission   Chronic renal disease, stage II   Last Assessment & Plan Note   10/05/2011 Office Visit Signed 10/05/2011  8:46 PM by Kathlen Brunswick, MD    Repeat metabolic profile will be obtained.    Tobacco abuse, in remission       Imaging: No results found.   FRS Calculation: Score not  calculated. Missing: Total Cholesterol

## 2012-12-01 NOTE — Progress Notes (Signed)
Patient ID: Timothy Bowen, male   DOB: 25-Oct-1946, 67 y.o.   MRN: [id number]  HPI: Scheduled return visit for this very nice gentleman with cardiomyopathy and hypertension. The etiology of left ventricular dysfunction is not certain. A stress nuclear study revealed moderately impaired LV systolic function with apical scar but no ischemia. Cardiac catheterization has never been performed. He is essentially asymptomatic and has been for the past 3 years.  Prior to Admission medications   Medication Sig Start Date End Date Taking? Authorizing Provider  amLODipine (NORVASC) 10 MG tablet Take 1 tablet (10 mg total) by mouth daily. 01/03/12 01/02/13 Yes Kathlen Brunswick, MD  carvedilol (COREG) 25 MG tablet Take 25 mg by mouth 2 (two) times daily.     Yes Historical Provider, MD  lisinopril (PRINIVIL,ZESTRIL) 20 MG tablet TAKE TWO TABLETS BY MOUTH EVERY DAY 12/05/11  Yes Jodelle Gross, NP  chlorthalidone (HYGROTON) 25 MG tablet Take 1 tablet (25 mg total) by mouth daily. 12/01/12   Kathlen Brunswick, MD  cloNIDine (CATAPRES) 0.1 MG tablet Take 1 tablet (0.1 mg total) by mouth 2 (two) times daily. 12/01/12   Kathlen Brunswick, MD  No Known Allergies    Past medical history, social history, and family history reviewed and updated.  ROS: Denies chest discomfort, dyspnea, orthopnea, PND, lightheadedness, syncope, claudication. All other systems reviewed and are negative.  PHYSICAL EXAM: BP 168/98  Pulse 89  Ht 5' 6.5" (1.689 m)  Wt 69.89 kg (154 lb 1.3 oz)  BMI 24.50 kg/m2  SpO2 97%  General-Well developed; no acute distress Body habitus-proportionate weight and height Neck-No JVD; no carotid bruits Lungs-clear lung fields; resonant to percussion Cardiovascular-normal PMI; normal S1 and S2 Abdomen-normal bowel sounds; soft and non-tender without masses or organomegaly Musculoskeletal-No deformities, no cyanosis or clubbing Neurologic-Normal cranial nerves; symmetric strength and  tone Skin-Warm, no significant lesions Extremities-distal pulses intact; no edema  ASSESSMENT AND PLAN:  [place] Bing, MD 12/01/2012 1:46 PM

## 2012-12-01 NOTE — Assessment & Plan Note (Signed)
Patient reports only modest use of alcohol. Although I cannot assure him that this is entirely safe, it is certainly preferable to excessive consumption. Clinically, he has markedly improved from his initial presentation with cardiomyopathy, possibly secondary to alcohol consumption, hypertension or both.

## 2012-12-01 NOTE — Patient Instructions (Addendum)
Your physician recommends that you schedule a follow-up appointment in:  1 - 5 months with Dr Dietrich Pates 2 - 1 month with nurse for a blood pressure check  Your physician recommends that you return for lab work in: 1 month (you will receive a reminder letter)  Your physician has recommended you make the following change in your medication:  1 - STOP Lasix 2 - START Chlorthalidone 25 mg daily 3 - START Clonidine 0.1 mg twice a day  Your physician has requested that you regularly monitor and record your blood pressure readings at home. Please use the same machine at the same time of day to check your readings and record them to bring to your follow-up visit.  Your physician has requested that you have an echocardiogram. Echocardiography is a painless test that uses sound waves to create images of your heart. It provides your doctor with information about the size and shape of your heart and how well your heart's chambers and valves are working. This procedure takes approximately one hour. There are no restrictions for this procedure.

## 2012-12-02 ENCOUNTER — Encounter: Payer: Self-pay | Admitting: Cardiology

## 2012-12-02 NOTE — Assessment & Plan Note (Signed)
Patient has had a benign clinical course, but sequential imaging studies have demonstrated very variable results with the most recent a stress nuclear study in 08/2011 suggesting moderate to severe impairment in LV systolic function, most likely on the basis of ischemic heart disease, without demonstrable ischemia. Ejection fraction will be reassessed by echocardiography, and if less than 35%, AICD implantation will be considered.

## 2012-12-02 NOTE — Assessment & Plan Note (Addendum)
Blood pressure control is suboptimal. In light of the patient's history of cardiomyopathy, substantially lower systolic and diastolic pressure would be desirable. Low dose furosemide will be changed to a thiazide diuretic, and clonidine added at low dose. Patient has been asked to monitor pressures at home, maintain a list, and return with these to a nursing visit in one month.

## 2012-12-30 ENCOUNTER — Ambulatory Visit (HOSPITAL_COMMUNITY)
Admission: RE | Admit: 2012-12-30 | Discharge: 2012-12-30 | Disposition: A | Discharge status: Home / Self Care | Payer: Medicare Other | Source: Ambulatory Visit | Attending: Cardiology | Admitting: Cardiology

## 2012-12-30 DIAGNOSIS — I10 Essential (primary) hypertension: Secondary | ICD-10-CM | POA: Insufficient documentation

## 2012-12-30 DIAGNOSIS — Z87891 Personal history of nicotine dependence: Secondary | ICD-10-CM | POA: Diagnosis not present

## 2012-12-30 DIAGNOSIS — I059 Rheumatic mitral valve disease, unspecified: Secondary | ICD-10-CM

## 2012-12-30 DIAGNOSIS — I428 Other cardiomyopathies: Secondary | ICD-10-CM | POA: Insufficient documentation

## 2012-12-30 NOTE — Progress Notes (Signed)
*  PRELIMINARY RESULTS* Echocardiogram 2D Echocardiogram has been performed.  Timothy Bowen 12/30/2012, 9:13 AM

## 2012-12-31 ENCOUNTER — Other Ambulatory Visit: Payer: Self-pay | Admitting: *Deleted

## 2012-12-31 DIAGNOSIS — R5383 Other fatigue: Secondary | ICD-10-CM

## 2012-12-31 DIAGNOSIS — R0602 Shortness of breath: Secondary | ICD-10-CM

## 2013-05-21 ENCOUNTER — Ambulatory Visit (INDEPENDENT_AMBULATORY_CARE_PROVIDER_SITE_OTHER): Payer: Medicare Other | Admitting: Adult Health

## 2013-05-21 ENCOUNTER — Encounter: Payer: Self-pay | Admitting: Adult Health

## 2013-05-21 VITALS — BP 146/82 | Ht 66.5 in | Wt 154.0 lb

## 2013-05-21 DIAGNOSIS — I428 Other cardiomyopathies: Secondary | ICD-10-CM

## 2013-05-21 DIAGNOSIS — I420 Dilated cardiomyopathy: Secondary | ICD-10-CM

## 2013-05-21 DIAGNOSIS — N182 Chronic kidney disease, stage 2 (mild): Secondary | ICD-10-CM | POA: Diagnosis not present

## 2013-05-21 DIAGNOSIS — I10 Essential (primary) hypertension: Secondary | ICD-10-CM

## 2013-05-21 MED ORDER — CARVEDILOL 25 MG PO TABS: 25.0000 mg | ORAL_TABLET | Freq: Two times a day (BID) | ORAL | Status: DC

## 2013-05-21 MED ORDER — LISINOPRIL 20 MG PO TABS: ORAL_TABLET | ORAL | Status: DC

## 2013-05-21 MED ORDER — FUROSEMIDE 40 MG PO TABS: 40.0000 mg | ORAL_TABLET | Freq: Every day | ORAL | Status: DC

## 2013-05-21 MED ORDER — AMLODIPINE BESYLATE 10 MG PO TABS: 10.0000 mg | ORAL_TABLET | Freq: Every day | ORAL | Status: DC

## 2013-05-21 NOTE — Assessment & Plan Note (Signed)
We will review new labs to assess kidney function on Lasix and ACE inhibitor. He is given refills on all medications to include Lasix. He is advised to avoid salt and to stop drinking.

## 2013-05-21 NOTE — Patient Instructions (Addendum)
Your physician recommends that you schedule a follow-up appointment in: 6 months You will receive a reminder letter in the mail in about 4 months reminding you to call and schedule your appointment. If you don't receive this letter, please contact our office.  .Your physician has recommended you make the following change in your medication:  1. Start lasix 40 mg daily.  Your physician recommends that you return for lab work today. BMET (slips given)

## 2013-05-21 NOTE — Assessment & Plan Note (Signed)
Blood pressure is controlled currently. In fact it is lower than according. Now that he is relaxing and sitting in the office blood pressures 138/78. I will continue him on his current medication regimen as this is working for him and is affordable to him.

## 2013-05-21 NOTE — Progress Notes (Deleted)
Name: Timothy Bowen    DOB: [birth year]  Age: 67 y.o.  MR#: [id number]       PCP:  Colette Ribas, MD      Insurance: Payor: MEDICARE / Plan: MEDICARE PART A AND B / Product Type: *No Product type* /   CC:    Chief Complaint  Patient presents with  . Cardiomyopathy  . Hypertension    VS Filed Vitals:   05/21/13 1439  BP: 146/82  Height: 5' 6.5" (1.689 m)  Weight: 154 lb (69.854 kg)    Weights Current Weight  05/21/13 154 lb (69.854 kg)  12/01/12 154 lb 1.3 oz (69.89 kg)  12/06/11 158 lb (71.668 kg)    Blood Pressure  BP Readings from Last 3 Encounters:  05/21/13 146/82  12/01/12 168/98  12/06/11 152/82     Admit date:  (Not on file) Last encounter with RMR:  Visit date not found   Allergy Review of patient's allergies indicates no known allergies.  Current Outpatient Prescriptions  Medication Sig Dispense Refill  . amLODipine (NORVASC) 10 MG tablet Take 1 tablet (10 mg total) by mouth daily.  30 tablet  12  . carvedilol (COREG) 25 MG tablet Take 25 mg by mouth 2 (two) times daily.        Marland Kitchen lisinopril (PRINIVIL,ZESTRIL) 20 MG tablet TAKE TWO TABLETS BY MOUTH EVERY DAY  60 tablet  6   No current facility-administered medications for this visit.    Discontinued Meds:    Medications Discontinued During This Encounter  Medication Reason  . chlorthalidone (HYGROTON) 25 MG tablet Completed Course  . cloNIDine (CATAPRES) 0.1 MG tablet Completed Course    Patient Active Problem List   Diagnosis Date Noted  . Dilated cardiomyopathy   . Chronic renal disease, stage II   . Tobacco abuse, in remission   . Hypertension 01/09/2011  . 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    LABS    Component Value Date/Time   NA 142 07/11/2010 2259   NA 140 08/18/2009 2147   NA 136 02/11/2009 0400   K 4.7 07/11/2010 2259   K 4.9 08/18/2009 2147   K 4.9 02/11/2009 0400   CL 105 07/11/2010 2259   CL 107 08/18/2009 2147   CL 104 02/11/2009 0400   CO2 26 07/11/2010 2259   CO2 20  08/18/2009 2147   CO2 28 02/11/2009 0400   GLUCOSE 72 07/11/2010 2259   GLUCOSE 91 08/18/2009 2147   GLUCOSE 91 02/11/2009 0400   BUN 23 07/11/2010 2259   BUN 38* 08/18/2009 2147   BUN 28* 02/11/2009 0400   CREATININE 1.53* 07/11/2010 2259   CREATININE 2.16* 08/18/2009 2147   CREATININE 1.76* 02/11/2009 0400   CALCIUM 9.5 07/11/2010 2259   CALCIUM 9.3 08/18/2009 2147   CALCIUM 8.6 02/11/2009 0400   GFRNONAA 40* 02/11/2009 0400   GFRNONAA 41* 02/10/2009 0415   GFRNONAA 35* 02/09/2009 0430   GFRAA  Value: 48        The eGFR has been calculated using the MDRD equation. This calculation has not been validated in all clinical situations. eGFR's persistently <60 mL/min signify possible Chronic Kidney Disease.* 02/11/2009 0400   GFRAA  Value: 50        The eGFR has been calculated using the MDRD equation. This calculation has not been validated in all clinical situations. eGFR's persistently <60 mL/min signify possible Chronic Kidney Disease.* 02/10/2009 0415   GFRAA  Value: 43        The  eGFR has been calculated using the MDRD equation. This calculation has not been validated in all clinical situations. eGFR's persistently <60 mL/min signify possible Chronic Kidney Disease.* 02/09/2009 0430   CMP     Component Value Date/Time   NA 142 07/11/2010 2259   K 4.7 07/11/2010 2259   CL 105 07/11/2010 2259   CO2 26 07/11/2010 2259   GLUCOSE 72 07/11/2010 2259   BUN 23 07/11/2010 2259   CREATININE 1.53* 07/11/2010 2259   CALCIUM 9.5 07/11/2010 2259   PROT 7.3 07/11/2010 2259   ALBUMIN 4.4 07/11/2010 2259   AST 16 07/11/2010 2259   ALT 12 07/11/2010 2259   ALKPHOS 65 07/11/2010 2259   BILITOT 0.7 07/11/2010 2259   GFRNONAA 40* 02/11/2009 0400   GFRAA  Value: 48        The eGFR has been calculated using the MDRD equation. This calculation has not been validated in all clinical situations. eGFR's persistently <60 mL/min signify possible Chronic Kidney Disease.* 02/11/2009 0400       Component Value Date/Time   WBC 7.3 07/11/2010 2259   WBC 5.1  08/18/2009 2147   WBC 5.8 06/16/2009 2125   HGB 14.2 07/11/2010 2259   HGB 10.9* 08/18/2009 2147   HGB 11.4* 06/16/2009 2125   HCT 41.3 07/11/2010 2259   HCT 33.3* 08/18/2009 2147   HCT 35.3* 06/16/2009 2125   MCV 101.7* 07/11/2010 2259   MCV 100.3* 08/18/2009 2147   MCV 101.4* 06/16/2009 2125    Lipid Panel     Component Value Date/Time   CHOL  Value: 131        ATP III CLASSIFICATION:  <200     mg/dL   Desirable  409-811  mg/dL   Borderline High  >=914    mg/dL   High        05/13/2955 0400   TRIG 56 02/08/2009 0400   HDL 32* 02/08/2009 0400   CHOLHDL 4.1 02/08/2009 0400   VLDL 11 02/08/2009 0400   LDLCALC  Value: 88        Total Cholesterol/HDL:CHD Risk Coronary Heart Disease Risk Table                     Men   Women  1/2 Average Risk   3.4   3.3  Average Risk       5.0   4.4  2 X Average Risk   9.6   7.1  3 X Average Risk  23.4   11.0        Use the calculated Patient Ratio above and the CHD Risk Table to determine the patient's CHD Risk.        ATP III CLASSIFICATION (LDL):  <100     mg/dL   Optimal  213-086  mg/dL   Near or Above                    Optimal  130-159  mg/dL   Borderline  578-469  mg/dL   High  >629     mg/dL   Very High 03/06/8412 2440    ABG No results found for this basename: phart, pco2, pco2art, po2, po2art, hco3, tco2, acidbasedef, o2sat     Lab Results  Component Value Date   TSH 2.394 ***Test methodology is 3rd generation TSH**** 02/08/2009   BNP (last 3 results) No results found for this basename: PROBNP,  in the last 8760 hours Cardiac Panel (last 3 results) No results found for  this basename: CKTOTAL, CKMB, TROPONINI, RELINDX,  in the last 72 hours  Iron/TIBC/Ferritin    Component Value Date/Time   IRON <10* 02/08/2009 1400   TIBC NOT CALC Not calculated due to Iron <10. 02/08/2009 1400   FERRITIN 98 06/16/2009 2125     EKG Orders placed in visit on 05/21/13  . EKG 12-LEAD     Prior Assessment and Plan Problem List as of 05/21/2013     Cardiovascular and  Mediastinum   Hypertension   Last Assessment & Plan   12/01/2012 Office Visit Edited 12/05/2012  7:51 AM by Kathlen Brunswick, MD     Blood pressure control is suboptimal. In light of the patient's history of cardiomyopathy, substantially lower systolic and diastolic pressure would be desirable. Low dose furosemide will be changed to a thiazide diuretic, and clonidine added at low dose. Patient has been asked to monitor pressures at home, maintain a list, and return with these to a nursing visit in one month.    Dilated cardiomyopathy   Last Assessment & Plan   12/01/2012 Office Visit Written 12/02/2012  8:30 PM by Kathlen Brunswick, MD     Patient has had a benign clinical course, but sequential imaging studies have demonstrated very variable results with the most recent a stress nuclear study in 08/2011 suggesting moderate to severe impairment in LV systolic function, most likely on the basis of ischemic heart disease, without demonstrable ischemia. Ejection fraction will be reassessed by echocardiography, and if less than 35%, AICD implantation will be considered.      Genitourinary   Chronic renal disease, stage II   Last Assessment & Plan   10/05/2011 Office Visit Written 10/05/2011  8:46 PM by Kathlen Brunswick, MD     Repeat metabolic profile will be obtained.      Other   Alcohol abuse, in remission   Last Assessment & Plan   12/01/2012 Office Visit Written 12/01/2012  1:46 PM by Kathlen Brunswick, MD     Patient reports only modest use of alcohol. Although I cannot assure him that this is entirely safe, it is certainly preferable to excessive consumption. Clinically, he has markedly improved from his initial presentation with cardiomyopathy, possibly secondary to alcohol consumption, hypertension or both.    Tobacco abuse, in remission       Imaging: No results found.

## 2013-05-21 NOTE — Progress Notes (Signed)
   HPI: Mr.Whitsell is a 67 year old patient of Dr. Dietrich Pates, we are following for ongoing assessment and management of dilated cardiomyopathy and hypertension. His last seen by Dr. Dietrich Pates in January 2014. At that time the patient was stable from a cardiac standpoint echo was read ordered to evaluate his LV function. If continued diminished AICD implantation would be considered. The patient was mildly hypertensive with a blood pressure 160/90 on last visit and therefore low-dose furosemide was changed to thiazide diuretic and clonidine added low-dose.   Echocardiogram completed in February 2014 demonstrated normal LV EF of 50-55% with mild hypokinesis of the basal and mid inferior lateral and apical myocardium. The patient was found to have grade 1 diastolic dysfunction. There were no lab results recorded.   He comes today without complaint. He was unable to afford medication changes and did not start the clonidine or chlorthalidone. He remains on Norvasc and Lasix as he was on prior to being seen. Blood pressure is better controlled No Known Allergies    Current Outpatient Prescriptions  Medication Sig Dispense Refill  . amLODipine (NORVASC) 10 MG tablet Take 1 tablet (10 mg total) by mouth daily.  30 tablet  6  . carvedilol (COREG) 25 MG tablet Take 1 tablet (25 mg total) by mouth 2 (two) times daily.  60 tablet  6  . lisinopril (PRINIVIL,ZESTRIL) 20 MG tablet TAKE TWO TABLETS BY MOUTH EVERY DAY  60 tablet  6  . furosemide (LASIX) 40 MG tablet Take 1 tablet (40 mg total) by mouth daily.  90 tablet  3   No current facility-administered medications for this visit.    Past Medical History  Diagnosis Date  . Dilated cardiomyopathy      Clinical congestive heart failure with EF of 25% in 02/2009; EF of 50-55% with distal septal hypokinesis in 2011  . Chronic renal disease, stage II     creatinine of 1.7 in 02/2009; 1.53 in 07/2010  . Tobacco abuse, in remission     40 pack years; quit in 2010  .  Hypertension   . Alcohol abuse, in remission     quit in 2010  . Peptic ulcer disease     H/o antral gastritis by upper endoscopy    Past Surgical History  Procedure Laterality Date  . Colonoscopy  2006    2006: neg screening study by pt report; 2010: Few diverticula, internal hemorrhoids    WUJ:WJXBJY of systems complete and found to be negative unless listed above  PHYSICAL EXAM BP 146/82  Ht 5' 6.5" (1.689 m)  Wt 154 lb (69.854 kg)  BMI 24.49 kg/m2  General: Well developed, well nourished, in no acute distress, several missing teeth. Head: Eyes PERRLA, No xanthomas.   Normal cephalic and atramatic  Lungs: Clear bilaterally to auscultation and percussion. Heart: HRRR S1 S2, without MRG.  Pulses are 2+ & equal.            No carotid bruit. No JVD.  No abdominal bruits. No femoral bruits. Abdomen: Bowel sounds are positive, abdomen soft and non-tender without masses or                  Hernia's noted. Msk:  Back normal, normal gait. Normal strength and tone for age. Extremities: No clubbing, cyanosis or edema.  DP +1 Neuro: Alert and oriented X 3. Psych:  Good affect, responds appropriately    ASSESSMENT AND PLAN

## 2013-05-21 NOTE — Assessment & Plan Note (Signed)
The patient will continue on his current medication regimen. Last echocardiogram demonstrated normal LV function. I have advised him to decrease his caffeine use and tobacco use. He admits to continuing alcohol use. I have told him that this is detrimental to his heart function and that he should cut down or stop completely. Repeat BMET.

## 2013-12-04 ENCOUNTER — Ambulatory Visit (INDEPENDENT_AMBULATORY_CARE_PROVIDER_SITE_OTHER): Payer: Medicare Other | Admitting: Adult Health

## 2013-12-04 ENCOUNTER — Encounter: Payer: Self-pay | Admitting: Adult Health

## 2013-12-04 VITALS — BP 134/68 | HR 78 | Ht 66.0 in | Wt 155.0 lb

## 2013-12-04 DIAGNOSIS — I10 Essential (primary) hypertension: Secondary | ICD-10-CM

## 2013-12-04 NOTE — Patient Instructions (Signed)
Your physician recommends that you schedule a follow-up appointment in: 6 months with Dr Lurena JoinerBranch You will receive a reminder letter two months in advance reminding you to call and schedule your appointment. If you don't receive this letter, please contact our office.  Your physician recommends that you return for lab work CMET, CBC, LIPID. NEXT WEEK.  Please be fasting.

## 2013-12-04 NOTE — Progress Notes (Deleted)
Name: Timothy Bowen    DOB: August 29, 1946  Age: 68 y.o.  MR#: [id number]       PCP:  Purvis Kilts, MD      Insurance: Payor: MEDICARE / Plan: MEDICARE PART A AND B / Product Type: *No Product type* /   CC:   No chief complaint on file.   VS Filed Vitals:   12/04/13 1442  BP: 134/68  Pulse: 78  Height: $Remove'5\' 6"'qnFkJWM$  (1.676 m)  Weight: 155 lb (70.308 kg)    Weights Current Weight  12/04/13 155 lb (70.308 kg)  05/21/13 154 lb (69.854 kg)  12/01/12 154 lb 1.3 oz (69.89 kg)    Blood Pressure  BP Readings from Last 3 Encounters:  12/04/13 134/68  05/21/13 146/82  12/01/12 168/98     Admit date:  (Not on file) Last encounter with RMR:  Visit date not found   Allergy Review of patient's allergies indicates no known allergies.  Current Outpatient Prescriptions  Medication Sig Dispense Refill  . amLODipine (NORVASC) 10 MG tablet Take 1 tablet (10 mg total) by mouth daily.  30 tablet  6  . carvedilol (COREG) 25 MG tablet Take 1 tablet (25 mg total) by mouth 2 (two) times daily.  60 tablet  6  . furosemide (LASIX) 40 MG tablet Take 1 tablet (40 mg total) by mouth daily.  90 tablet  3  . lisinopril (PRINIVIL,ZESTRIL) 20 MG tablet TAKE TWO TABLETS BY MOUTH EVERY DAY  60 tablet  6   No current facility-administered medications for this visit.    Discontinued Meds:   There are no discontinued medications.  Patient Active Problem List   Diagnosis Date Noted  . Dilated cardiomyopathy   . Chronic renal disease, stage II   . Tobacco abuse, in remission   . Hypertension 01/09/2011  . 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    LABS    Component Value Date/Time   NA 142 07/11/2010 2259   NA 140 08/18/2009 2147   NA 136 02/11/2009 0400   K 4.7 07/11/2010 2259   K 4.9 08/18/2009 2147   K 4.9 02/11/2009 0400   CL 105 07/11/2010 2259   CL 107 08/18/2009 2147   CL 104 02/11/2009 0400   CO2 26 07/11/2010 2259   CO2 20 08/18/2009 2147   CO2 28 02/11/2009 0400   GLUCOSE 72 07/11/2010 2259   GLUCOSE 91 08/18/2009 2147   GLUCOSE 91 02/11/2009 0400   BUN 23 07/11/2010 2259   BUN 38* 08/18/2009 2147   BUN 28* 02/11/2009 0400   CREATININE 1.53* 07/11/2010 2259   CREATININE 2.16* 08/18/2009 2147   CREATININE 1.76* 02/11/2009 0400   CALCIUM 9.5 07/11/2010 2259   CALCIUM 9.3 08/18/2009 2147   CALCIUM 8.6 02/11/2009 0400   GFRNONAA 40* 02/11/2009 0400   GFRNONAA 41* 02/10/2009 0415   GFRNONAA 35* 02/09/2009 0430   GFRAA  Value: 48        The eGFR has been calculated using the MDRD equation. This calculation has not been validated in all clinical situations. eGFR's persistently <60 mL/min signify possible Chronic Kidney Disease.* 02/11/2009 0400   GFRAA  Value: 50        The eGFR has been calculated using the MDRD equation. This calculation has not been validated in all clinical situations. eGFR's persistently <60 mL/min signify possible Chronic Kidney Disease.* 02/10/2009 0415   GFRAA  Value: 43        The eGFR has been calculated using the MDRD equation.  This calculation has not been validated in all clinical situations. eGFR's persistently <60 mL/min signify possible Chronic Kidney Disease.* 02/09/2009 0430   CMP     Component Value Date/Time   NA 142 07/11/2010 2259   K 4.7 07/11/2010 2259   CL 105 07/11/2010 2259   CO2 26 07/11/2010 2259   GLUCOSE 72 07/11/2010 2259   BUN 23 07/11/2010 2259   CREATININE 1.53* 07/11/2010 2259   CALCIUM 9.5 07/11/2010 2259   PROT 7.3 07/11/2010 2259   ALBUMIN 4.4 07/11/2010 2259   AST 16 07/11/2010 2259   ALT 12 07/11/2010 2259   ALKPHOS 65 07/11/2010 2259   BILITOT 0.7 07/11/2010 2259   GFRNONAA 40* 02/11/2009 0400   GFRAA  Value: 48        The eGFR has been calculated using the MDRD equation. This calculation has not been validated in all clinical situations. eGFR's persistently <60 mL/min signify possible Chronic Kidney Disease.* 02/11/2009 0400       Component Value Date/Time   WBC 7.3 07/11/2010 2259   WBC 5.1 08/18/2009 2147   WBC 5.8 06/16/2009 2125   HGB 14.2 07/11/2010 2259   HGB  10.9* 08/18/2009 2147   HGB 11.4* 06/16/2009 2125   HCT 41.3 07/11/2010 2259   HCT 33.3* 08/18/2009 2147   HCT 35.3* 06/16/2009 2125   MCV 101.7* 07/11/2010 2259   MCV 100.3* 08/18/2009 2147   MCV 101.4* 06/16/2009 2125    Lipid Panel     Component Value Date/Time   CHOL  Value: 131        ATP III CLASSIFICATION:  <200     mg/dL   Desirable  200-239  mg/dL   Borderline High  >=240    mg/dL   High        02/08/2009 0400   TRIG 56 02/08/2009 0400   HDL 32* 02/08/2009 0400   CHOLHDL 4.1 02/08/2009 0400   VLDL 11 02/08/2009 0400   LDLCALC  Value: 88        Total Cholesterol/HDL:CHD Risk Coronary Heart Disease Risk Table                     Men   Women  1/2 Average Risk   3.4   3.3  Average Risk       5.0   4.4  2 X Average Risk   9.6   7.1  3 X Average Risk  23.4   11.0        Use the calculated Patient Ratio above and the CHD Risk Table to determine the patient's CHD Risk.        ATP III CLASSIFICATION (LDL):  <100     mg/dL   Optimal  100-129  mg/dL   Near or Above                    Optimal  130-159  mg/dL   Borderline  160-189  mg/dL   High  >190     mg/dL   Very High 02/08/2009 0400    ABG No results found for this basename: phart, pco2, pco2art, po2, po2art, hco3, tco2, acidbasedef, o2sat     Lab Results  Component Value Date   TSH 2.394 ***Test methodology is 3rd generation TSH**** 02/08/2009   BNP (last 3 results) No results found for this basename: PROBNP,  in the last 8760 hours Cardiac Panel (last 3 results) No results found for this basename: CKTOTAL, CKMB, [place], RELINDX,  in  the last 72 hours  Iron/TIBC/Ferritin    Component Value Date/Time   IRON <10* 02/08/2009 1400   TIBC NOT CALC Not calculated due to Iron <10. 02/08/2009 1400   FERRITIN 98 06/16/2009 2125     EKG Orders placed in visit on 05/21/13  . EKG 12-LEAD     Prior Assessment and Plan Problem List as of 12/04/2013     Cardiovascular and Mediastinum   Hypertension   Last Assessment & Plan   05/21/2013 Office Visit  Written 05/21/2013  3:24 PM by Lendon Colonel, NP     Blood pressure is controlled currently. In fact it is lower than according. Now that he is relaxing and sitting in the office blood pressures 138/78. I will continue him on his current medication regimen as this is working for him and is affordable to him.    Dilated cardiomyopathy   Last Assessment & Plan   05/21/2013 Office Visit Written 05/21/2013  3:23 PM by Lendon Colonel, NP     The patient will continue on his current medication regimen. Last echocardiogram demonstrated normal LV function. I have advised him to decrease his caffeine use and tobacco use. He admits to continuing alcohol use. I have told him that this is detrimental to his heart function and that he should cut down or stop completely. Repeat BMET.      Genitourinary   Chronic renal disease, stage II   Last Assessment & Plan   05/21/2013 Office Visit Written 05/21/2013  3:23 PM by Lendon Colonel, NP     We will review new labs to assess kidney function on Lasix and ACE inhibitor. He is given refills on all medications to include Lasix. He is advised to avoid salt and to stop drinking.      Other   Alcohol abuse, in remission   Last Assessment & Plan   12/01/2012 Office Visit Written 12/01/2012  1:46 PM by Yehuda Savannah, MD     Patient reports only modest use of alcohol. Although I cannot assure him that this is entirely safe, it is certainly preferable to excessive consumption. Clinically, he has markedly improved from his initial presentation with cardiomyopathy, possibly secondary to alcohol consumption, hypertension or both.    Tobacco abuse, in remission       Imaging: No results found.

## 2013-12-04 NOTE — Assessment & Plan Note (Signed)
Most recent echocardiogram in February 2014 as stated demonstrated a normal LVEF with mild hypokinesis of the basal and mid inferior lateral apical myocardium. He remains medically compliant without complaints or symptoms. I will continue him on his current medication regimen.  He will have a CMET and CBC drawn. Stable be copied to Dr. Phillips OdorGolding primary care. He will followup with Dr. Wyline MoodBranch in 6 months to be est. with him..Marland Kitchen

## 2013-12-04 NOTE — Assessment & Plan Note (Signed)
Excellent control of blood pressure. He will be maintained on lisinopril 20 mg 2 tablets every other day and carvedilol 25 mg twice a day as directed. Labs are pending.

## 2013-12-04 NOTE — Progress Notes (Signed)
    HPI: Timothy Bowen is a 68 year old patient will be est. with Dr. Wyline Moodbranch we are following for ongoing assessment and management of dilated cardiomyopathy and hypertension. The patient last had an echocardiogram in February of 2014 was demonstrated normal LVEF of 50-55%, with mild hypokinesis of the basal and mid inferior lateral apical myocardium. He has grade 1 diastolic dysfunction.  The patient continues to be medically compliant, he denies any palpitations, chest pain, dizziness, fluid retention. His Lasix dose was decreased to 20 mg daily on last visit. He is doing well and is without complaints.  No Known Allergies  Current Outpatient Prescriptions  Medication Sig Dispense Refill  . amLODipine (NORVASC) 10 MG tablet Take 1 tablet (10 mg total) by mouth daily.  30 tablet  6  . carvedilol (COREG) 25 MG tablet Take 1 tablet (25 mg total) by mouth 2 (two) times daily.  60 tablet  6  . furosemide (LASIX) 40 MG tablet Take 1 tablet (40 mg total) by mouth daily.  90 tablet  3  . lisinopril (PRINIVIL,ZESTRIL) 20 MG tablet TAKE TWO TABLETS BY MOUTH EVERY DAY  60 tablet  6   No current facility-administered medications for this visit.    Past Medical History  Diagnosis Date  . Dilated cardiomyopathy      Clinical congestive heart failure with EF of 25% in 02/2009; EF of 50-55% with distal septal hypokinesis in 2011  . Chronic renal disease, stage II     creatinine of 1.7 in 02/2009; 1.53 in 07/2010  . Tobacco abuse, in remission     40 pack years; quit in 2010  . Hypertension   . Alcohol abuse, in remission     quit in 2010  . Peptic ulcer disease     H/o antral gastritis by upper endoscopy    Past Surgical History  Procedure Laterality Date  . Colonoscopy  2006    2006: neg screening study by pt report; 2010: Few diverticula, internal hemorrhoids    WUJ:WJXBJYROS:Review of systems complete and found to be negative unless listed above  PHYSICAL EXAM BP 134/68  Pulse 78  Ht 5\' 6"  (1.676  m)  Wt 155 lb (70.308 kg)  BMI 25.03 kg/m2  General: Well developed, well nourished, in no acute distress Head: Eyes PERRLA, No xanthomas.   Normal cephalic and atramatic  Lungs: Clear bilaterally to auscultation and percussion. Heart: HRRR S1 S2, without MRG.  Pulses are 2+ & equal.            No carotid bruit. No JVD.  No abdominal bruits. No femoral bruits. Abdomen: Bowel sounds are positive, abdomen soft and non-tender without masses or                  Hernia's noted. Msk:  Back normal, normal gait. Normal strength and tone for age. Extremities: No clubbing, cyanosis or edema.  DP +1 Neuro: Alert and oriented X 3. Psych:  Good affect, responds appropriately    ASSESSMENT AND PLAN

## 2014-07-14 ENCOUNTER — Encounter: Payer: Self-pay | Admitting: Internal Medicine

## 2014-07-30 ENCOUNTER — Telehealth: Payer: Self-pay | Admitting: Adult Health

## 2014-07-30 NOTE — Telephone Encounter (Signed)
Please see refill bin / tgs  °

## 2014-08-23 ENCOUNTER — Telehealth: Payer: Self-pay | Admitting: Adult Health

## 2014-08-23 NOTE — Telephone Encounter (Signed)
-  FAXED TO PHARMACY.

## 2014-08-23 NOTE — Telephone Encounter (Signed)
Please see refill bin / tgs  °

## 2014-09-14 ENCOUNTER — Encounter: Payer: Self-pay | Admitting: Cardiology

## 2014-09-14 ENCOUNTER — Ambulatory Visit (INDEPENDENT_AMBULATORY_CARE_PROVIDER_SITE_OTHER): Payer: Medicare Other | Admitting: Cardiology

## 2014-09-14 VITALS — BP 162/86 | HR 88 | Ht 66.0 in | Wt 166.0 lb

## 2014-09-14 DIAGNOSIS — R739 Hyperglycemia, unspecified: Secondary | ICD-10-CM | POA: Diagnosis not present

## 2014-09-14 DIAGNOSIS — I10 Essential (primary) hypertension: Secondary | ICD-10-CM

## 2014-09-14 DIAGNOSIS — I5022 Chronic systolic (congestive) heart failure: Secondary | ICD-10-CM | POA: Diagnosis not present

## 2014-09-14 DIAGNOSIS — E039 Hypothyroidism, unspecified: Secondary | ICD-10-CM

## 2014-09-14 DIAGNOSIS — N182 Chronic kidney disease, stage 2 (mild): Secondary | ICD-10-CM | POA: Diagnosis not present

## 2014-09-14 NOTE — Progress Notes (Signed)
Clinical Summary Timothy Bowen is a 68 y.o.male last seen by NP Lyman BishopLawrence, this is our first visit together. He is seen for the following medical problems.  1.Chronic systolic heart failure - previously had LV systolic dysfunction, LVEF 25% by echo 02/2009. Normalized LVEF by last echo 12/2012 at 50-55% - denies any SOB or DOE, no LE edema, no orthopnea, no PND - compliant with meds  2. HTN  - does not check bp at home regularly - compliant with meds, though has not taken yet today  3. CKD - no recent labs in our system, he denies any recent labs with pcp. Last Cr we have is 1.5 from 2011  Past Medical History  Diagnosis Date  . Dilated cardiomyopathy      Clinical congestive heart failure with EF of 25% in 02/2009; EF of 50-55% with distal septal hypokinesis in 2011  . Chronic renal disease, stage II     creatinine of 1.7 in 02/2009; 1.53 in 07/2010  . Tobacco abuse, in remission     40 pack years; quit in 2010  . Hypertension   . Alcohol abuse, in remission     quit in 2010  . Peptic ulcer disease     H/o antral gastritis by upper endoscopy     No Known Allergies   Current Outpatient Prescriptions  Medication Sig Dispense Refill  . amLODipine (NORVASC) 10 MG tablet Take 1 tablet (10 mg total) by mouth daily. 30 tablet 6  . carvedilol (COREG) 25 MG tablet Take 1 tablet (25 mg total) by mouth 2 (two) times daily. 60 tablet 6  . furosemide (LASIX) 40 MG tablet Take 1 tablet (40 mg total) by mouth daily. 90 tablet 3  . lisinopril (PRINIVIL,ZESTRIL) 20 MG tablet TAKE TWO TABLETS BY MOUTH EVERY DAY 60 tablet 6   No current facility-administered medications for this visit.     Past Surgical History  Procedure Laterality Date  . Colonoscopy  2006    2006: neg screening study by pt report; 2010: Few diverticula, internal hemorrhoids     No Known Allergies    Family History  Problem Relation Age of Onset  . Stroke Mother   . Heart attack Father   . Ovarian cancer  Neg Hx   . Colon cancer Neg Hx   . Colon polyps Neg Hx   . Breast cancer Cousin      Social History Timothy Bowen reports that he quit smoking about 5 years ago. He has never used smokeless tobacco. Timothy Bowen reports that he does not drink alcohol.   Review of Systems CONSTITUTIONAL: No weight loss, fever, chills, weakness or fatigue.  HEENT: Eyes: No visual loss, blurred vision, double vision or yellow sclerae.No hearing loss, sneezing, congestion, runny nose or sore throat.  SKIN: No rash or itching.  CARDIOVASCULAR: per HPI RESPIRATORY: No shortness of breath, cough or sputum.  GASTROINTESTINAL: No anorexia, nausea, vomiting or diarrhea. No abdominal pain or blood.  GENITOURINARY: No burning on urination, no polyuria NEUROLOGICAL: No headache, dizziness, syncope, paralysis, ataxia, numbness or tingling in the extremities. No change in bowel or bladder control.  MUSCULOSKELETAL: No muscle, back pain, joint pain or stiffness.  LYMPHATICS: No enlarged nodes. No history of splenectomy.  PSYCHIATRIC: No history of depression or anxiety.  ENDOCRINOLOGIC: No reports of sweating, cold or heat intolerance. No polyuria or polydipsia.  Marland Kitchen.   Physical Examination p 88 bp 162/86 Wt 166 lbs BMI 27 Gen: resting comfortably, no acute distress  HEENT: no scleral icterus, pupils equal round and reactive, no palptable cervical adenopathy,  CV: RRR, no m/r/g, no JVD, no carotid bruits Resp: Clear to auscultation bilaterally GI: abdomen is soft, non-tender, non-distended, normal bowel sounds, no hepatosplenomegaly MSK: extremities are warm, no edema.  Skin: warm, no rash Neuro:  no focal deficits Psych: appropriate affect   Diagnostic Studies 12/2012 Echo Study Conclusions  - Left ventricle: The cavity size was at the upper limits of normal. Wall thickness was increased in a pattern of mild LVH. There was moderate focal basal hypertrophy. Systolic function was low normal. The estimated  ejection fraction was in the range of 50% to 55%. There is mild hypokinesis of the basal-midinferolateral and apical myocardium. Doppler parameters are consistent with abnormal left ventricular relaxation (grade 1 diastolic dysfunction). Doppler parameters are consistent with elevated ventricular end-diastolic filling pressure. Unable to compare with previous study from 2011, technical problems. - Aortic valve: Mild regurgitation. - Ascending aorta: The ascending aorta was mildly ectatic. - Mitral valve: Mild regurgitation. - Left atrium: The atrium was mildly dilated. - Right ventricle: The moderator band was prominent. - Right atrium: Central venous pressure: 5mm Hg (est). - Tricuspid valve: Trivial regurgitation. - Pulmonary arteries: Systolic pressure could not be accurately estimated. - Pericardium, extracardiac: There was no pericardial effusion.    Assessment and Plan   1. Chronic systolic heart failure - LVEF has since normalized, denies any significant current symptoms. Appears euvolemic in clinic - continue current meds  2. HTN - elevated in clinic, he has not taken his meds yet today, unclear how well controlled he is. Goal blood pressure given CKD < 130/80, will have him return for a nursing visit with bp check in 1-2 weeks after taking his meds  3. CKD - no recent labs in our system, will repeat CMET.      Antoine PocheJonathan F. Branch, M.D.

## 2014-09-14 NOTE — Patient Instructions (Addendum)
Your physician wants you to follow-up in: 1 year You will receive a reminder letter in the mail two months in advance. If you don't receive a letter, please call our office to schedule the follow-up appointment.   Return in 2 weeks for nurse visit BP check Your physician recommends that you continue on your current medications as directed. Please refer to the Current Medication list given to you today.   Please get FASTING lab work   Thank you for [facility] !

## 2014-09-28 ENCOUNTER — Ambulatory Visit (INDEPENDENT_AMBULATORY_CARE_PROVIDER_SITE_OTHER): Payer: Medicare Other

## 2014-09-28 VITALS — BP 170/98 | HR 67

## 2014-09-28 DIAGNOSIS — I129 Hypertensive chronic kidney disease with stage 1 through stage 4 chronic kidney disease, or unspecified chronic kidney disease: Secondary | ICD-10-CM

## 2014-09-28 DIAGNOSIS — I10 Essential (primary) hypertension: Secondary | ICD-10-CM

## 2014-09-28 DIAGNOSIS — I5022 Chronic systolic (congestive) heart failure: Secondary | ICD-10-CM

## 2014-09-28 DIAGNOSIS — N189 Chronic kidney disease, unspecified: Secondary | ICD-10-CM | POA: Diagnosis not present

## 2014-09-28 NOTE — Progress Notes (Signed)
Pt seen for BP check today,states he has not missed any doses  BP 170/98   Have forwarded to Dr.Branch

## 2014-09-28 NOTE — Patient Instructions (Signed)
Dr.Branch will review your blood pressure results today.   I will call you with instructions      Use Mrs.Dash as salt salt substitute      Thank you for choosing [facility] !

## 2014-10-04 ENCOUNTER — Telehealth: Payer: Self-pay

## 2014-10-04 MED ORDER — CARVEDILOL 25 MG PO TABS: 37.5000 mg | ORAL_TABLET | Freq: Two times a day (BID) | ORAL | Status: DC

## 2014-10-04 NOTE — Progress Notes (Signed)
BP above goal, please have him increase his coreg to 37.5mg bid   J Sriman Tally MD 

## 2014-10-04 NOTE — Telephone Encounter (Signed)
-----   Message from Antoine PocheJonathan F Branch, MD sent at 10/04/2014  2:07 PM EST -----   ----- Message -----    From: Nori Riisatherine A Giovanni Bath, RN    Sent: 09/28/2014   3:06 PM      To: Antoine PocheJonathan F Branch, MD

## 2014-10-04 NOTE — Telephone Encounter (Signed)
Status: Signed       Expand All Collapse All   Pt seen for BP check today,states he has not missed any doses  BP 170/98   Have forwarded to Dr.Branch            Timothy PocheJonathan F Branch, MD at 10/04/2014 2:06 PM     Status: Signed       Expand All Collapse All   BP above goal, please have him increase his coreg to 37.5mg  bid   Dominga FerryJ Branch MD            Timothy PocheJonathan F Branch, MD at 10/04/2014 2:06 PM     Status: Signed       Expand All Collapse All   BP above goal, please have him increase his coreg to 37.5mg  bid.  Dominga FerryJ Branch MD         I spoke with pt,he verbalizes understanding to take Coreg 37.5 mg BID, e-scribed new rx

## 2014-10-04 NOTE — Progress Notes (Signed)
BP above goal, please have him increase his coreg to 37.5mg  bid   Dominga FerryJ Lorinda Copland MD

## 2014-10-11 ENCOUNTER — Telehealth: Payer: Self-pay

## 2014-10-11 NOTE — Telephone Encounter (Signed)
Pt in route to pick up free BP cuff provided by Reynolds AmericanJerrold Bowen,drug rep

## 2015-08-29 ENCOUNTER — Ambulatory Visit (HOSPITAL_COMMUNITY)
Admission: RE | Admit: 2015-08-29 | Discharge: 2015-08-29 | Disposition: A | Discharge status: Home / Self Care | Payer: Medicare Other | Source: Ambulatory Visit | Attending: Internal Medicine | Admitting: Internal Medicine

## 2015-08-29 ENCOUNTER — Other Ambulatory Visit (HOSPITAL_COMMUNITY): Payer: Self-pay | Admitting: Internal Medicine

## 2015-08-29 DIAGNOSIS — J40 Bronchitis, not specified as acute or chronic: Secondary | ICD-10-CM

## 2015-09-14 ENCOUNTER — Encounter: Payer: Self-pay | Admitting: Cardiology

## 2015-09-14 ENCOUNTER — Ambulatory Visit (INDEPENDENT_AMBULATORY_CARE_PROVIDER_SITE_OTHER): Payer: Medicare Other | Admitting: Cardiology

## 2015-09-14 VITALS — BP 164/90 | HR 90 | Ht 66.0 in | Wt 164.0 lb

## 2015-09-14 DIAGNOSIS — E785 Hyperlipidemia, unspecified: Secondary | ICD-10-CM

## 2015-09-14 DIAGNOSIS — I10 Essential (primary) hypertension: Secondary | ICD-10-CM

## 2015-09-14 DIAGNOSIS — Z131 Encounter for screening for diabetes mellitus: Secondary | ICD-10-CM | POA: Diagnosis not present

## 2015-09-14 MED ORDER — CARVEDILOL 25 MG PO TABS: 37.5000 mg | ORAL_TABLET | Freq: Two times a day (BID) | ORAL | Status: AC

## 2015-09-14 MED ORDER — AMLODIPINE BESYLATE 10 MG PO TABS: 10.0000 mg | ORAL_TABLET | Freq: Every day | ORAL | Status: AC

## 2015-09-14 MED ORDER — FUROSEMIDE 20 MG PO TABS: 20.0000 mg | ORAL_TABLET | Freq: Every day | ORAL | Status: AC

## 2015-09-14 NOTE — Patient Instructions (Addendum)
Your physician recommends that you schedule a follow-up appointment in: 1 month  Dr.Golding [phone]   INCREASE Coreg (Carvedilol) to 37.5 mg ( 1 1/2 tablets ) twice a day  Please schedule US for AAA screening, Your physician has requested that you have an abdominal aorta duplex. During this test, an ultrasound is used to evaluate the aorta. Allow 30 minutes for this exam. Do not eat after midnight the day before and avoid carbonated beverages apt date 09/19/15 at 8:45 am in radiology   Keep BP log for 2 weeks and then return to office  Get FASTING blood work,lab slip provided     If you need a refill on your cardiac medications before your next appointment, please call your pharmacy.      Thank you for choosing [facility] !

## 2015-09-14 NOTE — Addendum Note (Signed)
Addended by: Marlyn CorporalARLTON, CATHERINE A on: 09/14/2015 02:59 PM   Modules accepted: Orders

## 2015-09-14 NOTE — Progress Notes (Signed)
Patient ID: Timothy Bowen, male   DOB: [birth year], 69 y.o.   MRN: [id number]     Clinical Summary Mr. Hogeland is a 69 y.o.male seen today for follow up of the following medical problems.   1.Chronic systolic heart failure - previously had LV systolic dysfunction, LVEF 25% by echo 02/2009. Normalized LVEF by last echo 12/2012 at 50-55% - he denies any SOB or DOE, no LE edema, no orthopnea, no PND   2. HTN  - checks at home daily. Typically around 160s/80s - mixed compliance with meds. Takes about 4/7 days a week. He is only taking coreg 12.5mg  bid as opposed to the prescribed 37.5mg  bid.  - his coreg and norvasc are bottled dated from 2013.   3. CKD - no recent labs in our system, he denies any recent labs with pcp.   4. Former tobacco - quit 2010. No SOB or cough. Has never had AAA screening US   Past Medical History  Diagnosis Date  . Dilated cardiomyopathy      Clinical congestive heart failure with EF of 25% in 02/2009; EF of 50-55% with distal septal hypokinesis in 2011  . Chronic renal disease, stage II     creatinine of 1.7 in 02/2009; 1.53 in 07/2010  . Tobacco abuse, in remission     40 pack years; quit in 2010  . Hypertension   . Alcohol abuse, in remission     quit in 2010  . Peptic ulcer disease     H/o antral gastritis by upper endoscopy     No Known Allergies   Current Outpatient Prescriptions  Medication Sig Dispense Refill  . amLODipine (NORVASC) 10 MG tablet Take 1 tablet (10 mg total) by mouth daily. 30 tablet 6  . carvedilol (COREG) 25 MG tablet Take 1.5 tablets (37.5 mg total) by mouth 2 (two) times daily with a meal. 90 tablet 11  . furosemide (LASIX) 40 MG tablet Take 1 tablet (40 mg total) by mouth daily. 90 tablet 3  . lisinopril (PRINIVIL,ZESTRIL) 20 MG tablet TAKE TWO TABLETS BY MOUTH EVERY DAY 60 tablet 6   No current facility-administered medications for this visit.     Past Surgical History  Procedure Laterality Date  . Colonoscopy   2006    2006: neg screening study by pt report; 2010: Few diverticula, internal hemorrhoids     No Known Allergies    Family History  Problem Relation Age of Onset  . Stroke Mother   . Heart attack Father   . Ovarian cancer Neg Hx   . Colon cancer Neg Hx   . Colon polyps Neg Hx   . Breast cancer Cousin      Social History Mr. Devera reports that he quit smoking about 6 years ago. His smoking use included Cigarettes. He started smoking about 49 years ago. He smoked 1.00 pack per day. He has never used smokeless tobacco. Mr. Gettis reports that he does not drink alcohol.   Review of Systems CONSTITUTIONAL: No weight loss, fever, chills, weakness or fatigue.  HEENT: Eyes: No visual loss, blurred vision, double vision or yellow sclerae.No hearing loss, sneezing, congestion, runny nose or sore throat.  SKIN: No rash or itching.  CARDIOVASCULAR: per HPI RESPIRATORY: No shortness of breath, cough or sputum.  GASTROINTESTINAL: No anorexia, nausea, vomiting or diarrhea. No abdominal pain or blood.  GENITOURINARY: No burning on urination, no polyuria NEUROLOGICAL: No headache, dizziness, syncope, paralysis, ataxia, numbness or tingling in the extremities. No change in  bowel or bladder control.  MUSCULOSKELETAL: No muscle, back pain, joint pain or stiffness.  LYMPHATICS: No enlarged nodes. No history of splenectomy.  PSYCHIATRIC: No history of depression or anxiety.  ENDOCRINOLOGIC: No reports of sweating, cold or heat intolerance. No polyuria or polydipsia.  Marland Kitchen.   Physical Examination Filed Vitals:   09/14/15 0826  BP: 164/90  Pulse: 90   Filed Vitals:   09/14/15 0826  Height: 5\' 6"  (1.676 m)  Weight: 164 lb (74.39 kg)    Gen: resting comfortably, no acute distress HEENT: no scleral icterus, pupils equal round and reactive, no palptable cervical adenopathy,  CV: RRR, no m/r/g, no jvd, no carotid bruits Resp: Clear to auscultation bilaterally GI: abdomen is soft,  non-tender, non-distended, normal bowel sounds, no hepatosplenomegaly MSK: extremities are warm, no edema.  Skin: warm, no rash Neuro:  no focal deficits Psych: appropriate affect   Diagnostic Studies 12/2012 Echo Study Conclusions  - Left ventricle: The cavity size was at the upper limits of normal. Wall thickness was increased in a pattern of mild LVH. There was moderate focal basal hypertrophy. Systolic function was low normal. The estimated ejection fraction was in the range of 50% to 55%. There is mild hypokinesis of the basal-midinferolateral and apical myocardium. Doppler parameters are consistent with abnormal left ventricular relaxation (grade 1 diastolic dysfunction). Doppler parameters are consistent with elevated ventricular end-diastolic filling pressure. Unable to compare with previous study from 2011, technical problems. - Aortic valve: Mild regurgitation. - Ascending aorta: The ascending aorta was mildly ectatic. - Mitral valve: Mild regurgitation. - Left atrium: The atrium was mildly dilated. - Right ventricle: The moderator band was prominent. - Right atrium: Central venous pressure: 5mm Hg (est). - Tricuspid valve: Trivial regurgitation. - Pulmonary arteries: Systolic pressure could not be accurately estimated. - Pericardium, extracardiac: There was no pericardial effusion.    Assessment and Plan  1. Chronic systolic heart failure - LVEF has since normalized, denies any significant current symptoms. - continue current meds  2. HTN - elevated in clinic and by home numbers. Poor medication compliance, he also is not taking the correct dose of coreg - counseled on the importance of bp control, especially given his history of CKD. Goal bp <130/80 - will start coreg 37.5mg  bid, he will keep bp log x 2 weeks and submit  3. CKD - will order labs   F/u 1 month. Order annual labs. He has not seen his pcp in sometime, we gave him the  number for Dr Phillips OdorGolding to schedule a f/u       Antoine PocheJonathan F. Dawnell Bryant, M.D.

## 2015-09-19 ENCOUNTER — Ambulatory Visit (HOSPITAL_COMMUNITY)
Admission: RE | Admit: 2015-09-19 | Discharge: 2015-09-19 | Disposition: A | Discharge status: Home / Self Care | Payer: Medicare Other | Source: Ambulatory Visit | Attending: Cardiology | Admitting: Cardiology

## 2015-09-19 DIAGNOSIS — Z87891 Personal history of nicotine dependence: Secondary | ICD-10-CM | POA: Diagnosis not present

## 2015-09-19 DIAGNOSIS — Z1389 Encounter for screening for other disorder: Secondary | ICD-10-CM | POA: Insufficient documentation

## 2015-09-19 DIAGNOSIS — Z136 Encounter for screening for cardiovascular disorders: Secondary | ICD-10-CM | POA: Diagnosis not present

## 2015-09-19 DIAGNOSIS — I714 Abdominal aortic aneurysm, without rupture: Secondary | ICD-10-CM | POA: Diagnosis not present

## 2015-09-19 DIAGNOSIS — I10 Essential (primary) hypertension: Secondary | ICD-10-CM

## 2015-10-24 ENCOUNTER — Encounter: Payer: Self-pay | Admitting: Cardiology

## 2015-10-24 ENCOUNTER — Ambulatory Visit (INDEPENDENT_AMBULATORY_CARE_PROVIDER_SITE_OTHER): Payer: Medicare Other | Admitting: Cardiology

## 2015-10-24 VITALS — BP 180/88 | HR 88 | Ht 66.5 in | Wt 166.0 lb

## 2015-10-24 DIAGNOSIS — I10 Essential (primary) hypertension: Secondary | ICD-10-CM

## 2015-10-24 NOTE — Progress Notes (Signed)
Patient ID: Timothy SavoyFreeman Timothy Bowen, male   DOB: [birth year], 69 y.o.   MRN: [id number]     Clinical Summary [name] is Timothy 69 y.o.male seen today for Timothy focused visit on his history of poorly controlled blood pressure.    1. HTN  - last visit increased coreg to 37.5mg  bid - he has kept bp log since last visit, bps 130s-160s/70-80s. He is typically checking his bp first thing in the morning before taking meds - he stopped taking his lisionpril at home, from his report the cost increased to around $20 and he as nota able to afford it.    2. 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 Dilated cardiomyopathy (HCC)      Clinical congestive heart failure with EF of 25% in 02/2009; EF of 50-55% with distal septal hypokinesis in 2011  . Chronic renal disease, stage II     creatinine of 1.7 in 02/2009; 1.53 in 07/2010  . Tobacco abuse, in remission     40 pack years; quit in 2010  . Hypertension   . Alcohol abuse, in remission     quit in 2010  . Peptic ulcer disease     H/o antral gastritis by upper endoscopy     No Known Allergies   Current Outpatient Prescriptions  Medication Sig Dispense Refill  . amLODipine (NORVASC) 10 MG tablet Take 1 tablet (10 mg total) by mouth daily. 30 tablet 6  . carvedilol (COREG) 25 MG tablet Take 1.5 tablets (37.5 mg total) by mouth 2 (two) times daily with Timothy meal. 90 tablet 11  . furosemide (LASIX) 20 MG tablet Take 1 tablet (20 mg total) by mouth daily. 90 tablet 3   No current facility-administered medications for this visit.     Past Surgical History  Procedure Laterality Date  . Colonoscopy  2006    2006: neg screening study by pt report; 2010: Few diverticula, internal hemorrhoids     No Known Allergies    Family History  Problem Relation Age of Onset  . Stroke Mother   . Heart attack Father   . Ovarian cancer Neg Hx   . Colon cancer Neg Hx   . Colon polyps Neg Hx     . Breast cancer Cousin      Social History [name] reports that he quit smoking about 6 years ago. His smoking use included Cigarettes. He started smoking about 50 years ago. He smoked 1.00 pack per day. He has never used smokeless tobacco. [name] reports that he does not drink alcohol.   Review of Systems CONSTITUTIONAL: No weight loss, fever, chills, weakness or fatigue.  HEENT: Eyes: No visual loss, blurred vision, double vision or yellow sclerae.No hearing loss, sneezing, congestion, runny nose or sore throat.  SKIN: No rash or itching.  CARDIOVASCULAR: no chest pain, no palpitations.  RESPIRATORY: No shortness of breath, cough or sputum.  GASTROINTESTINAL: No anorexia, nausea, vomiting or diarrhea. No abdominal pain or blood.  GENITOURINARY: No burning on urination, no polyuria NEUROLOGICAL: No headache, dizziness, syncope, paralysis, ataxia, numbness or tingling in the extremities. No change in bowel or bladder control.  MUSCULOSKELETAL: No muscle, back pain, joint pain or stiffness.  LYMPHATICS: No enlarged nodes. No history of splenectomy.  PSYCHIATRIC: No history of depression or anxiety.  ENDOCRINOLOGIC: No reports of sweating, cold or heat intolerance. No polyuria or polydipsia.  Marland Kitchen.   Physical  Examination Filed Vitals:   10/24/15 0815  Pulse: 88   Filed Weights   10/24/15 0815  Weight: 166 lb (75.297 kg)    Gen: resting comfortably, no acute distress HEENT: no scleral icterus, pupils equal round and reactive, no palptable cervical adenopathy,  CV: RRR, no m/r/g, no jvd Resp: Clear to auscultation bilaterally GI: abdomen is soft, non-tender, non-distended, normal bowel sounds, no hepatosplenomegaly MSK: extremities are warm, no edema.  Skin: warm, no rash Neuro:  no focal deficits Psych: appropriate affect   Diagnostic Studies  12/2012 Echo Study Conclusions  - Left ventricle: The cavity size was at the upper limits of normal. Wall thickness was  increased in Timothy pattern of mild LVH. There was moderate focal basal hypertrophy. Systolic function was low normal. The estimated ejection fraction was in the range of 50% to 55%. There is mild hypokinesis of the basal-midinferolateral and apical myocardium. Doppler parameters are consistent with abnormal left ventricular relaxation (grade 1 diastolic dysfunction). Doppler parameters are consistent with elevated ventricular end-diastolic filling pressure. Unable to compare with previous study from 2011, technical problems. - Aortic valve: Mild regurgitation. - Ascending aorta: The ascending aorta was mildly ectatic. - Mitral valve: Mild regurgitation. - Left atrium: The atrium was mildly dilated. - Right ventricle: The moderator band was prominent. - Right atrium: Central venous pressure: 5mm Hg (est). - Tricuspid valve: Trivial regurgitation. - Pulmonary arteries: Systolic pressure could not be accurately estimated. - Pericardium, extracardiac: There was no pericardial effusion.     Assessment and Plan  1. HTN - home bp's are variable, however he tends to take his pressure in the AM prior to taking any meds. Asked to start checking at least 1 hour after taking his bp meds, he will keep bp log until next visit - he stopped his lisionpril due to cost. Will f/u labs (he has none in our system in quite some time), pending results consider restarting ACE-I, we should be able to get at an affordable cost for him  2. AAA - mild by recent US, repeat in 3 years.        F/u 2 monnths  Antoine Poche, M.D.

## 2015-10-24 NOTE — Patient Instructions (Signed)
Medication Instructions:  Your physician recommends that you continue on your current medications as directed. Please refer to the Current Medication list given to you today.   Labwork: NONE  Testing/Procedures: NONE  Follow-Up: Your physician recommends that you schedule a follow-up appointment in: 2 Months with Dr. Wyline MoodBranch   Any Other Special Instructions Will Be Listed Below (If Applicable). Your physician has requested that you regularly monitor and record your blood pressure readings at home. Please use the same machine at the same time of day to check your readings and record them to bring to your follow-up visit.      If you need a refill on your cardiac medications before your next appointment, please call your pharmacy.

## 2016-01-02 ENCOUNTER — Encounter: Payer: Self-pay | Admitting: Cardiology

## 2016-01-02 ENCOUNTER — Ambulatory Visit (INDEPENDENT_AMBULATORY_CARE_PROVIDER_SITE_OTHER): Payer: Medicare Other | Admitting: Cardiology

## 2016-01-02 VITALS — BP 156/78 | HR 86 | Ht 66.0 in | Wt 162.0 lb

## 2016-01-02 DIAGNOSIS — I10 Essential (primary) hypertension: Secondary | ICD-10-CM

## 2016-01-02 DIAGNOSIS — I714 Abdominal aortic aneurysm, without rupture, unspecified: Secondary | ICD-10-CM

## 2016-01-02 NOTE — Patient Instructions (Signed)
Your physician wants you to follow-up in: 1 year You will receive a reminder letter in the mail two months in advance. If you don't receive a letter, please call our office to schedule the follow-up appointment.   Your physician recommends that you continue on your current medications as directed. Please refer to the Current Medication list given to you today.    If you need a refill on your cardiac medications before your next appointment, please call your pharmacy.      Thank you for choosing [facility] !        

## 2016-01-02 NOTE — Progress Notes (Signed)
Patient ID: Timothy Bowen, male   DOB: 24-Dec-1945, 70 y.o.   MRN: [id number]     Clinical Summary [name] is a 70 y.o.male seen today for a focused visit on his history of poorly controlled blood pressure.    1. HTN  - home bp log 120-140/60-70s - compliant with meds  2. 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   Past Medical History  Diagnosis Date  . Dilated cardiomyopathy (HCC)      Clinical congestive heart failure with EF of 25% in 02/2009; EF of 50-55% with distal septal hypokinesis in 2011  . Chronic renal disease, stage II     creatinine of 1.7 in 02/2009; 1.53 in 07/2010  . Tobacco abuse, in remission     40 pack years; quit in 2010  . Hypertension   . Alcohol abuse, in remission     quit in 2010  . Peptic ulcer disease     H/o antral gastritis by upper endoscopy     No Known Allergies   Current Outpatient Prescriptions  Medication Sig Dispense Refill  . amLODipine (NORVASC) 10 MG tablet Take 1 tablet (10 mg total) by mouth daily. 30 tablet 6  . carvedilol (COREG) 25 MG tablet Take 1.5 tablets (37.5 mg total) by mouth 2 (two) times daily with a meal. 90 tablet 11  . furosemide (LASIX) 20 MG tablet Take 1 tablet (20 mg total) by mouth daily. 90 tablet 3   No current facility-administered medications for this visit.     Past Surgical History  Procedure Laterality Date  . Colonoscopy  2006    2006: neg screening study by pt report; 2010: Few diverticula, internal hemorrhoids     No Known Allergies    Family History  Problem Relation Age of Onset  . Stroke Mother   . Heart attack Father   . Ovarian cancer Neg Hx   . Colon cancer Neg Hx   . Colon polyps Neg Hx   . Breast cancer Cousin      Social History [name] reports that he quit smoking about 6 years ago. His smoking use included Cigarettes. He started smoking about 50 years ago. He smoked 1.00 pack per day. He has never used smokeless  tobacco. [name] reports that he does not drink alcohol.   Review of Systems CONSTITUTIONAL: No weight loss, fever, chills, weakness or fatigue.  HEENT: Eyes: No visual loss, blurred vision, double vision or yellow sclerae.No hearing loss, sneezing, congestion, runny nose or sore throat.  SKIN: No rash or itching.  CARDIOVASCULAR: no chest pain, no palpitations.  RESPIRATORY: No shortness of breath, cough or sputum.  GASTROINTESTINAL: No anorexia, nausea, vomiting or diarrhea. No abdominal pain or blood.  GENITOURINARY: No burning on urination, no polyuria NEUROLOGICAL: No headache, dizziness, syncope, paralysis, ataxia, numbness or tingling in the extremities. No change in bowel or bladder control.  MUSCULOSKELETAL: No muscle, back pain, joint pain or stiffness.  LYMPHATICS: No enlarged nodes. No history of splenectomy.  PSYCHIATRIC: No history of depression or anxiety.  ENDOCRINOLOGIC: No reports of sweating, cold or heat intolerance. No polyuria or polydipsia.  Marland Kitchen   Physical Examination Filed Vitals:   01/02/16 1449  BP: 156/78  Pulse: 86   Filed Vitals:   01/02/16 1449  Height:  (1.676 m)  Weight: 162 lb (73.483 kg)    Gen: resting comfortably, no acute distress HEENT: no scleral icterus, pupils equal  round and reactive, no palptable cervical adenopathy,  CV: RRR, no m/r/g, no jvd Resp: Clear to auscultation bilaterally GI: abdomen is soft, non-tender, non-distended, normal bowel sounds, no hepatosplenomegaly MSK: extremities are warm, no edema.  Skin: warm, no rash Neuro:  no focal deficits Psych: appropriate affect   Diagnostic Studies  12/2012 Echo Study Conclusions  - Left ventricle: The cavity size was at the upper limits of normal. Wall thickness was increased in a pattern of mild LVH. There was moderate focal basal hypertrophy. Systolic function was low normal. The estimated ejection fraction was in the range of 50% to 55%. There is mild  hypokinesis of the basal-midinferolateral and apical myocardium. Doppler parameters are consistent with abnormal left ventricular relaxation (grade 1 diastolic dysfunction). Doppler parameters are consistent with elevated ventricular end-diastolic filling pressure. Unable to compare with previous study from 2011, technical problems. - Aortic valve: Mild regurgitation. - Ascending aorta: The ascending aorta was mildly ectatic. - Mitral valve: Mild regurgitation. - Left atrium: The atrium was mildly dilated. - Right ventricle: The moderator band was prominent. - Right atrium: Central venous pressure: 5mm Hg (est). - Tricuspid valve: Trivial regurgitation. - Pulmonary arteries: Systolic pressure could not be accurately estimated. - Pericardium, extracardiac: There was no pericardial effusion.    Assessment and Plan  1. HTN - bp at goal, continue current meds  2. AAA - mild by recent US, - will need  repeat in 3 years.       Timothy Bowen, M.D.

## 2016-06-27 ENCOUNTER — Encounter: Payer: Self-pay | Admitting: Cardiology

## 2016-11-29 DIAGNOSIS — R404 Transient alteration of awareness: Secondary | ICD-10-CM | POA: Diagnosis not present

## 2016-12-06 DIAGNOSIS — 419620001 Death: Secondary | SNOMED CT | POA: Diagnosis not present

## 2016-12-06 DEATH — deceased

## 2016-12-28 ENCOUNTER — Ambulatory Visit: Payer: Medicare Other | Admitting: Cardiology

## 2017-04-05 IMAGING — US US AORTA SCREENING (MEDICARE)
1 series · 14 of 19 positions shown · non-contrast
Comparison: None.

CLINICAL DATA: Medicare screening exam for abdominal aortic
aneurysm. Former smoker.

EXAM:
ABDOMINAL AORTA SCREENING ULTRASOUND
TECHNIQUE: Ultrasound examination of the abdominal aorta was performed as a
screening evaluation for abdominal aortic aneurysm.

[Series 1: us aorta screening (medicare) · 0.22mm/px · 14 of 19 slices shown]
[im 1/19]
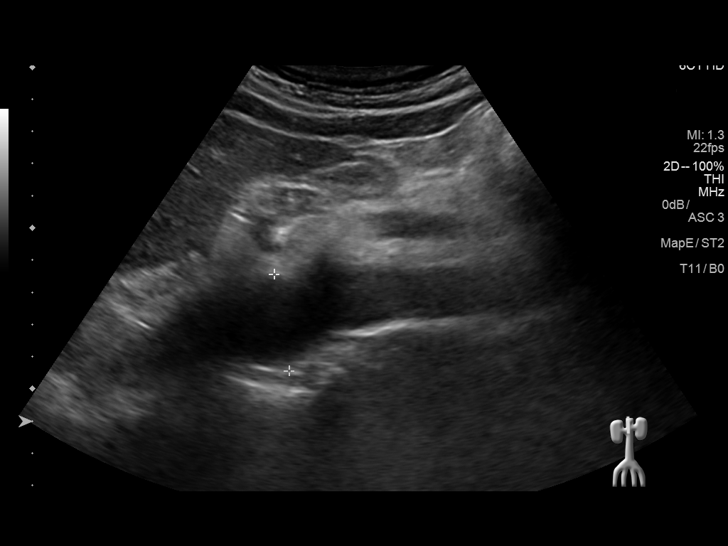
[im 3/19]
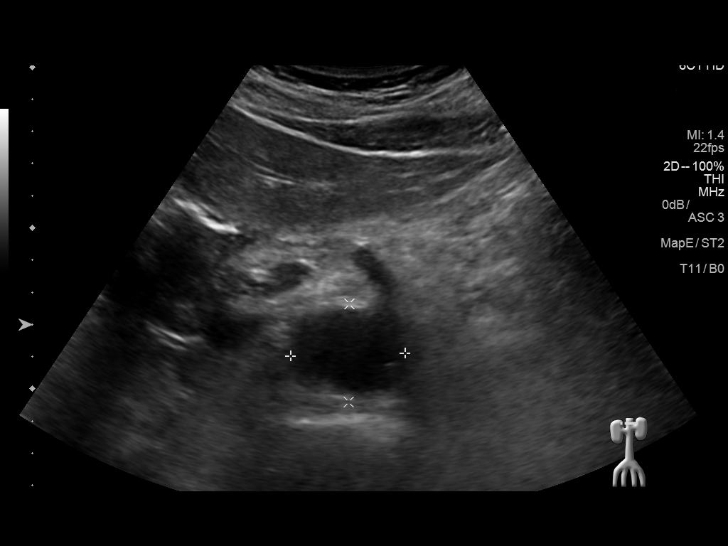
[im 4/19]
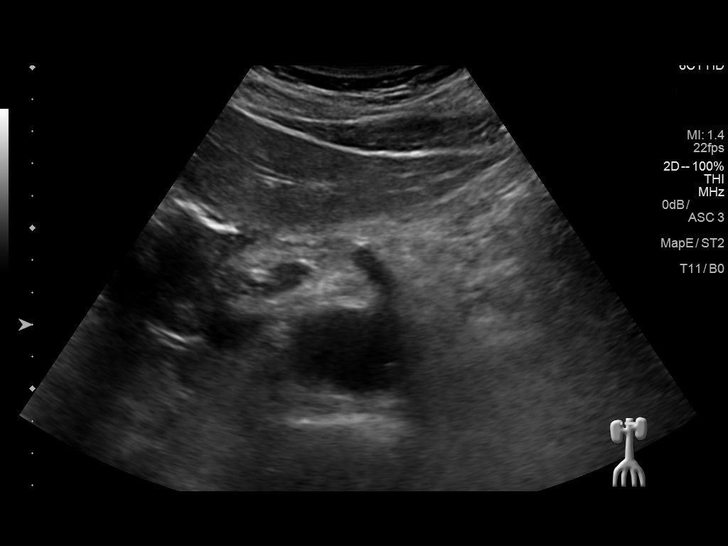
[im 5/19]
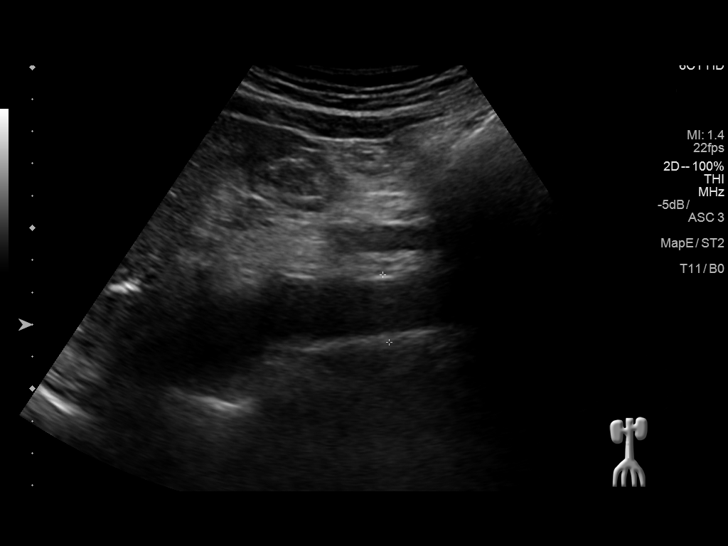
[im 7/19]
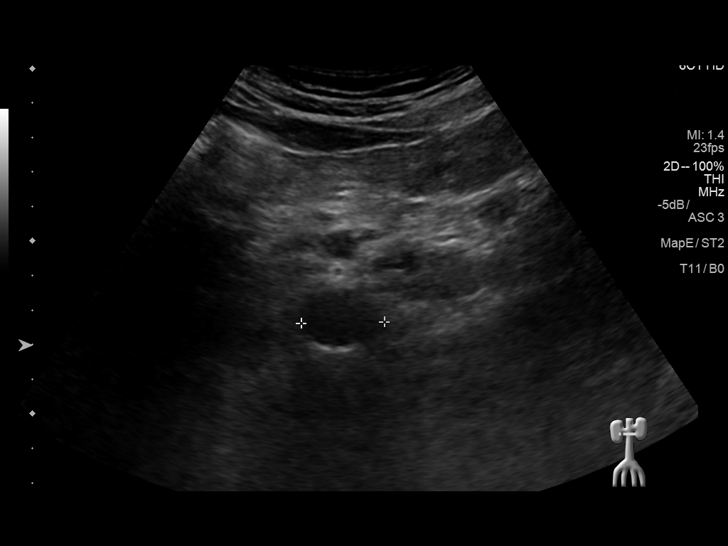
[im 8/19]
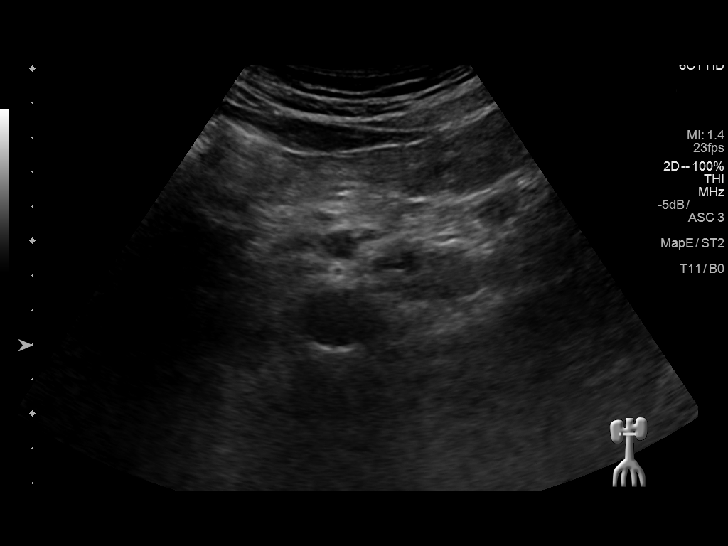
[im 9/19]
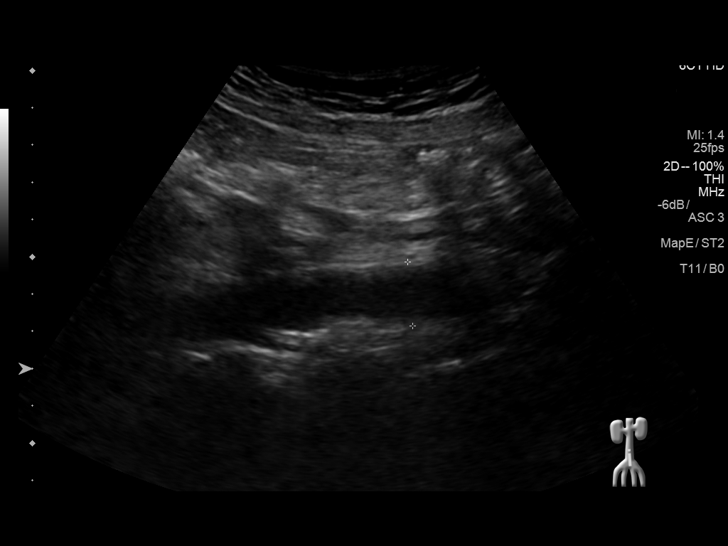
[im 11/19]
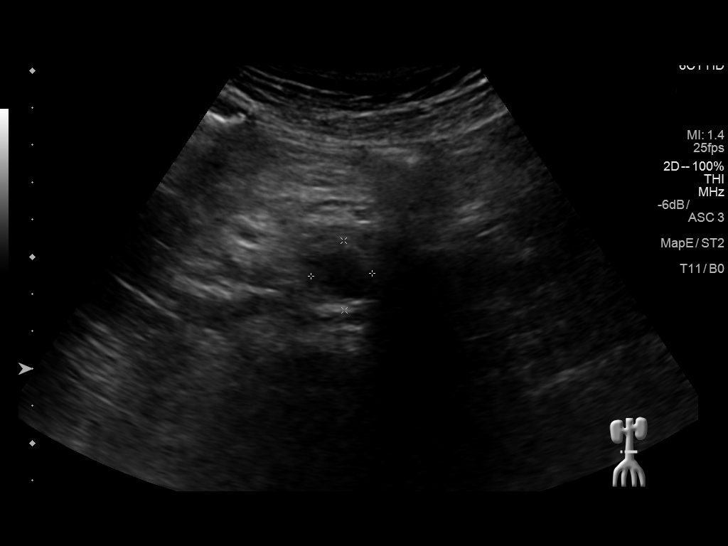
[im 12/19]
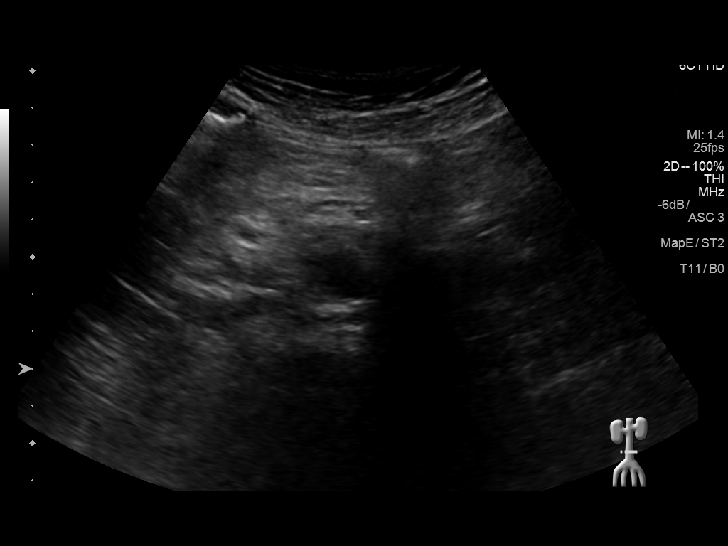
[im 13/19]
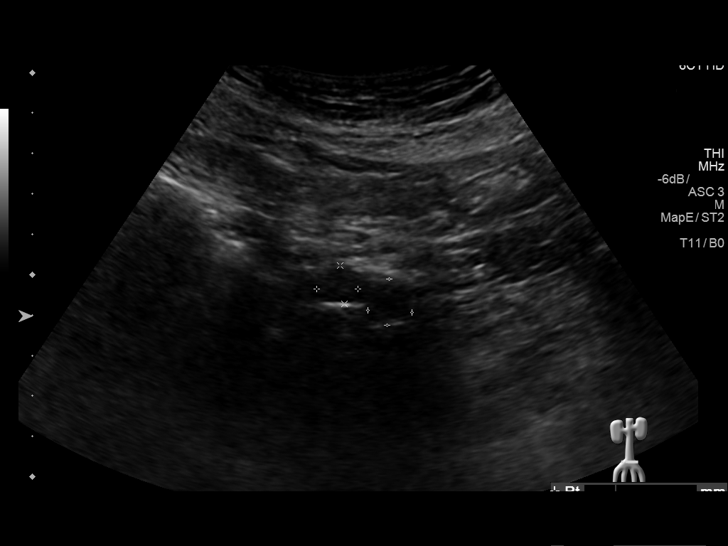
[im 15/19]
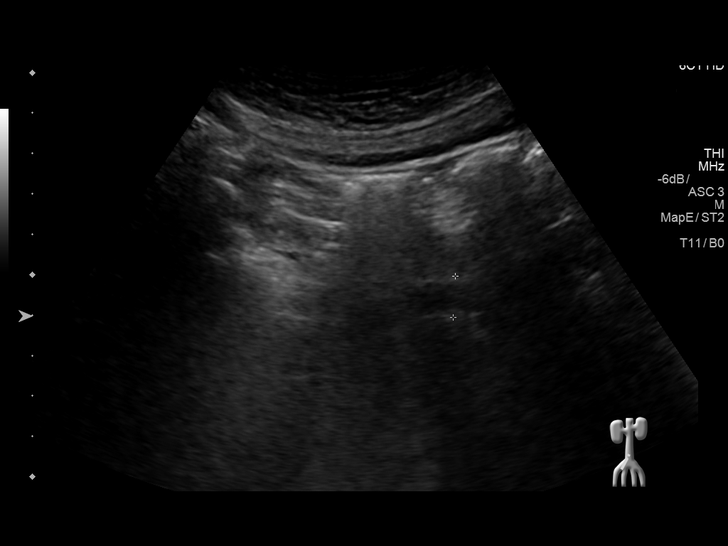
[im 16/19]
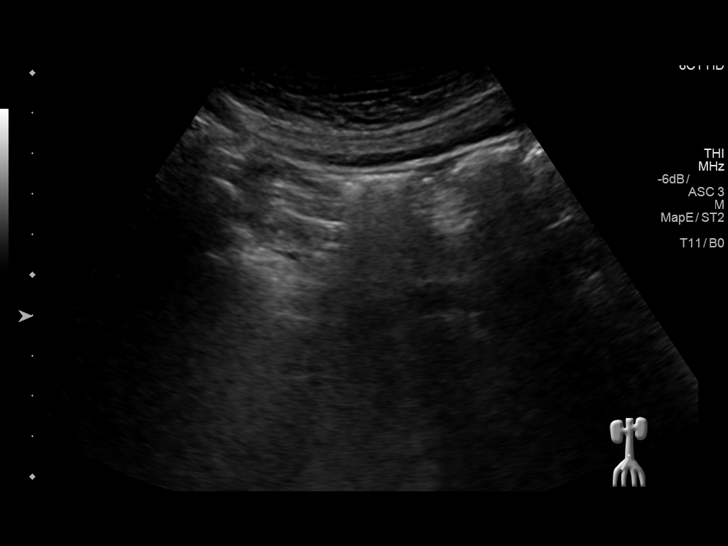
[im 17/19]
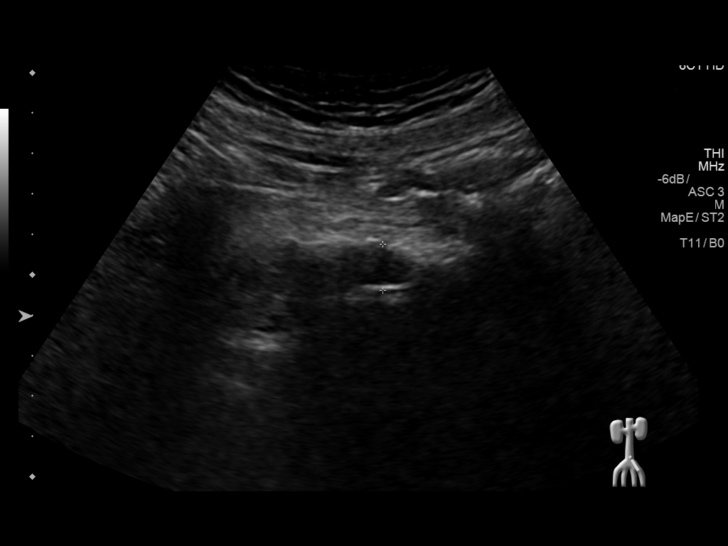
[im 19/19]
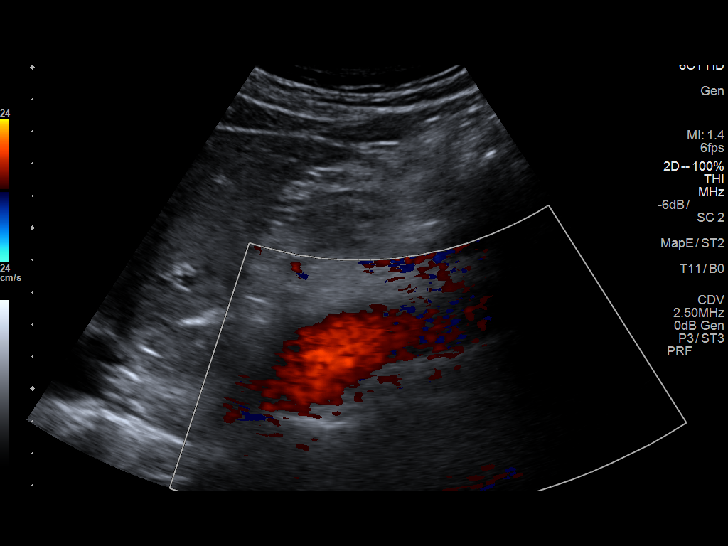

[14 of 19 positions shown; findings below may reference images not displayed]

FINDINGS: Abdominal Aorta

Mild proximal abdominal aortic aneurysm is seen measuring 3.2 cm in
diameter. No evidence of aneurysm involving the mid or distal
abdominal aorta. Mild atherosclerotic plaque noted throughout the
abdominal aorta.

Maximum Diameter: 3.2 cm proximally.
IMPRESSION: Mild proximal abdominal aortic aneurysm measuring 3.2 cm. Recommend
followup by ultrasound in 3 years. This recommendation follows ACR
consensus guidelines: White Paper of the ACR Incidental Findings
